# Patient Record
Sex: Male | Born: 1935 | Race: White | Hispanic: No | Smoking: Former smoker
Health system: Southern US, Community
[De-identification: ages and names within clinical notes are randomized; demographics above are authoritative.]

## PROBLEM LIST (undated history)

## (undated) DIAGNOSIS — M1990 Unspecified osteoarthritis, unspecified site: Secondary | ICD-10-CM

## (undated) DIAGNOSIS — D509 Iron deficiency anemia, unspecified: Principal | ICD-10-CM

## (undated) DIAGNOSIS — E785 Hyperlipidemia, unspecified: Secondary | ICD-10-CM

## (undated) DIAGNOSIS — I10 Essential (primary) hypertension: Secondary | ICD-10-CM

## (undated) DIAGNOSIS — I2510 Atherosclerotic heart disease of native coronary artery without angina pectoris: Secondary | ICD-10-CM

## (undated) HISTORY — DX: Unspecified osteoarthritis, unspecified site: M19.90

## (undated) HISTORY — DX: Hyperlipidemia, unspecified: E78.5

## (undated) HISTORY — DX: Atherosclerotic heart disease of native coronary artery without angina pectoris: I25.10

## (undated) HISTORY — PX: JOINT REPLACEMENT: SHX530

## (undated) HISTORY — DX: Essential (primary) hypertension: I10

## (undated) HISTORY — DX: Iron deficiency anemia, unspecified: D50.9

## (undated) HISTORY — PX: OTHER SURGICAL HISTORY: SHX169

---
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
## 1966-10-20 HISTORY — PX: CIRCUMCISION: SUR203

## 2000-01-13 ENCOUNTER — Ambulatory Visit (HOSPITAL_COMMUNITY): Admission: RE | Admit: 2000-01-13 | Discharge: 2000-01-14 | Payer: Self-pay | Admitting: Cardiology

## 2000-01-13 HISTORY — PX: CARDIAC CATHETERIZATION: SHX172

## 2000-05-05 ENCOUNTER — Ambulatory Visit (HOSPITAL_COMMUNITY): Admission: RE | Admit: 2000-05-05 | Discharge: 2000-05-06 | Payer: Self-pay | Admitting: Cardiology

## 2000-05-05 HISTORY — PX: CARDIAC CATHETERIZATION: SHX172

## 2010-05-27 ENCOUNTER — Ambulatory Visit: Payer: Self-pay | Admitting: Cardiology

## 2010-05-30 ENCOUNTER — Ambulatory Visit: Payer: Self-pay | Admitting: Cardiology

## 2010-07-08 ENCOUNTER — Inpatient Hospital Stay (HOSPITAL_COMMUNITY): Admission: RE | Admit: 2010-07-08 | Discharge: 2010-07-13 | Payer: Self-pay | Admitting: Orthopedic Surgery

## 2010-10-08 ENCOUNTER — Ambulatory Visit: Payer: Self-pay | Admitting: Cardiology

## 2010-10-10 ENCOUNTER — Ambulatory Visit: Payer: Self-pay | Admitting: Cardiology

## 2011-01-02 LAB — BASIC METABOLIC PANEL
BUN: 19 mg/dL (ref 6–23)
BUN: 22 mg/dL (ref 6–23)
CO2: 25 mEq/L (ref 19–32)
CO2: 25 mEq/L (ref 19–32)
CO2: 27 mEq/L (ref 19–32)
Chloride: 101 mEq/L (ref 96–112)
Chloride: 102 mEq/L (ref 96–112)
Chloride: 104 mEq/L (ref 96–112)
Chloride: 105 mEq/L (ref 96–112)
GFR calc Af Amer: >60 mL/min (ref >60)
GFR calc Af Amer: >60 mL/min (ref >60)
GFR calc non Af Amer: >60 mL/min (ref >60)
Glucose, Bld: 102 mg/dL — ABNORMAL HIGH (ref 70–99)
Glucose, Bld: 131 mg/dL — ABNORMAL HIGH (ref 70–99)
Potassium: 3.7 mEq/L (ref 3.5–5.1)
Potassium: 3.8 mEq/L (ref 3.5–5.1)
Potassium: 3.9 mEq/L (ref 3.5–5.1)
Sodium: 132 mEq/L — ABNORMAL LOW (ref 135–145)
Sodium: 133 mEq/L — ABNORMAL LOW (ref 135–145)

## 2011-01-02 LAB — CBC
HCT: 22.9 % — ABNORMAL LOW (ref 39.0–52.0)
HCT: 23.9 % — ABNORMAL LOW (ref 39.0–52.0)
HCT: 25.6 % — ABNORMAL LOW (ref 39.0–52.0)
HCT: 27.5 % — ABNORMAL LOW (ref 39.0–52.0)
Hemoglobin: 8.1 g/dL — ABNORMAL LOW (ref 13.0–17.0)
Hemoglobin: 8.6 g/dL — ABNORMAL LOW (ref 13.0–17.0)
MCH: 27.8 pg (ref 26.0–34.0)
MCH: 28.4 pg (ref 26.0–34.0)
MCH: 28.2 pg (ref 26.0–34.0)
MCHC: 34.1 g/dL (ref 30.0–36.0)
MCHC: 34 g/dL (ref 30.0–36.0)
MCV: 82.4 fL (ref 78.0–100.0)
MCV: 82.7 fL (ref 78.0–100.0)
MCV: 82.8 fL (ref 78.0–100.0)
MCV: 83 fL (ref 78.0–100.0)
Platelets: 216 10*3/uL (ref 150–400)
RBC: 2.76 MIL/uL — ABNORMAL LOW (ref 4.22–5.81)
RBC: 3.1 MIL/uL — ABNORMAL LOW (ref 4.22–5.81)
RBC: 3.31 MIL/uL — ABNORMAL LOW (ref 4.22–5.81)
RDW: 14 % (ref 11.5–15.5)
WBC: 4 10*3/uL (ref 4.0–10.5)
WBC: 4.3 10*3/uL (ref 4.0–10.5)

## 2011-01-02 LAB — URINALYSIS, ROUTINE W REFLEX MICROSCOPIC
Bilirubin Urine: NEGATIVE
Hgb urine dipstick: NEGATIVE
Nitrite: NEGATIVE
Specific Gravity, Urine: 1.014 (ref 1.005–1.030)
pH: 7 (ref 5.0–8.0)

## 2011-01-02 LAB — COMPREHENSIVE METABOLIC PANEL
AST: 20 U/L (ref 0–37)
Albumin: 3.9 g/dL (ref 3.5–5.2)
CO2: 30 mEq/L (ref 19–32)
Calcium: 9.4 mg/dL (ref 8.4–10.5)
Creatinine, Ser: 1.47 mg/dL (ref 0.4–1.5)
GFR calc Af Amer: 55 mL/min — ABNORMAL LOW (ref >60)
GFR calc non Af Amer: 46 mL/min — ABNORMAL LOW (ref >60)

## 2011-01-02 LAB — SURGICAL PCR SCREEN
MRSA, PCR: NEGATIVE
Staphylococcus aureus: POSITIVE — AB

## 2011-01-02 LAB — PROTIME-INR
INR: 2 — ABNORMAL HIGH (ref 0.00–1.49)
INR: 2.03 — ABNORMAL HIGH (ref 0.00–1.49)
Prothrombin Time: 17.3 seconds — ABNORMAL HIGH (ref 11.6–15.2)
Prothrombin Time: 22.8 seconds — ABNORMAL HIGH (ref 11.6–15.2)

## 2011-01-02 LAB — HEMOGLOBIN AND HEMATOCRIT, BLOOD
HCT: 30.3 % — ABNORMAL LOW (ref 39.0–52.0)
Hemoglobin: 10.5 g/dL — ABNORMAL LOW (ref 13.0–17.0)

## 2011-01-02 LAB — CROSSMATCH
ABO/RH(D): O NEG
Antibody Screen: NEGATIVE

## 2011-01-02 LAB — TYPE AND SCREEN: Antibody Screen: NEGATIVE

## 2011-01-02 LAB — APTT: aPTT: 29 seconds (ref 24–37)

## 2011-03-07 NOTE — Cardiovascular Report (Signed)
. [facility]  Patient:    Luis Rowe, Luis Rowe                       MRN: [id number] Proc. Date: 05/05/00 Adm. Date:  [id number] Attending:  Eleanora Neighbor                        Cardiac Catheterization  PROCEDURES: 1. Left heart catheterization. 2. Selective coronary angiography. 3. Left ventricular angiography. 4. Angioplasty (cutting balloon for in-stent restenosis).  TYPE AND SITE OF ENTRY:  Percutaneous right femoral artery, with Perclose.  CATHETERS: 1. 6-French four-curved Judkins right and left coronary catheters. 2. 6-French pigtail ventriculographic catheter. 3. 7-French JY7 guiding catheter. 4. A 3.75 x 10 mm cutting balloon. 5. Guide Wire Adult nurse.  MEDICATIONS GIVEN DURING PROCEDURE:  Heparin 4000 units IV, ReoPro, Versed.  COMMENTS:  The patient tolerated the procedure well.  HEMODYNAMIC DATA: 1. Aortic pressure:  148/67. 2. Left ventricular pressure:  147/15. There was no aortic valve gradient noted on pullback.  ANGIOGRAPHIC DATA:  LEFT VENTRICULOGRAM:  Performed in the RAO position.  The overall cardiac size and silhouette are within normal limits.  Left ventricular function is normal.   CORONARY ARTERIES: 1. LEFT MAIN:  Has a 20-30% ostial narrowing. 2. LEFT ANTERIOR DESCENDING:  Has a 90-95% stenosis within the proximal 1/3 of    the stent.  There is a moderately large bifurcating diagonal system with a    30-40% proximal narrowing.  At this bifurcation there is another 30-40%    narrowing within the left anterior descending artery itself. 3. LEFT CIRCUMFLEX:  Bifurcates into two moderate sized marginal vessels;    without significant stenosis. 4. RIGHT CORONARY ARTERY:  A reasonably large and dominant vessel.  There are    irregularities proximally.  At the acute margin there is no greater than    30-40% narrowing.  In the posterior descending vessel (approximately 3 cm    after the crux), there is a 60%  narrowing at the point of bifurcation. It    is a relatively small vessel beyond that.  There is a continuation branch    that is free of significant stenosis.  ANGIOPLASTY PROCEDURE:  We exchanged for the 7-French guide systems.  We were able to satisfactorily use this without catheter dampening to a significant degree.  The Patriot wire was passed into the distal left anterior descending.  We were able to position the cutting balloon without particular difficulty into the proximal portions of the stent; felt comfortable that it really did not extend beyond the more proximal margin of the stent.  The balloon was inflated to 8 atm.  It was a 3.75 balloon, which would be the maximum that the stent was inflated.  The final angiographic result was felt to be excellent.  There was residual mild narrowing proximal to the stent, but it was felt to be less than 30% and did not warrant angioplasty at that junction.  OVERALL IMPRESSION: 1. Successful treatment of in-stent restenosis. 2. Moderate distal right coronary artery disease. 3. Normal left ventricular function. 4. Mild proximal left main coronary artery stenosis.  DISCUSSION:  It is felt that the patient has a satisfactory chance of having successful treatment of the restenotic area within the stent. DD:  05/05/00 TD:  05/06/00 Job: [id number]

## 2011-03-07 NOTE — Discharge Summary (Signed)
[place]. [facility]  Patient:    Luis Rowe, Luis Rowe                         MRN: [id number] Adm. Date:  05/05/00 Disc. Date: 05/06/00 Attending:  Colleen Can. Deborah Chalk, M.D. Dictator:   Jennet Maduro. Earl Gala, R.N., A.N.P. CC:         Clovis Pu. Patty Sermons, M.D.                           Discharge Summary  PRIMARY DISCHARGE DIAGNOSIS:  Recurrent chest pain, consistent with in-stent restenosis with subsequent elective cardiac catheterization and angioplasty with a 3.75 x 10 mm cutting balloon of the left anterior descending coronary artery.  SECONDARY DISCHARGE DIAGNOSES: 1. Atherosclerotic cardiovascular disease with a previous history of cardiac    catheterization on January 13, 2000 with subsequent stent deployment to the    left anterior descending.  There was residual 70% mid posterior descending    narrowing. 2. Hypercholesterolemia, currently on Zocor. 3. Osteoarthritis.  HISTORY OF PRESENT ILLNESS:  Luis Rowe is a very pleasant 77 year old white male who has known atherosclerotic cardiovascular disease.  He underwent cardiac catheterization due to episodes of exertional angina in March 2001. At that time, he had a stent placed to the LAD, and presents now with a two- to three-day history of recurrent angina that is exertional in nature and relieved promptly with rest and nitroglycerin.  It is identical to his previous chest pain syndrome.  He was subsequently referred on for repeat cardiac catheterization.  Please see the dictated history and physical for further patient presentation and profile.  ADMISSION LABORATORY DATA:  Chemistry studies were all within normal limits with sodium 138, potassium 4.7, chloride 98, CO2 28, BUN 22, creatinine 1.1, glucose 88.  PT and PTT were unremarkable.  CBC showed hemoglobin 14, hematocrit 42, white count 7.8, and platelets 222.  HOSPITAL COURSE:  The patient was admitted from the short-stay center in order to undergo  coronary angiography per Dr. Roger Shelter.  The overall procedure was tolerated well without any known complications.  Left ventricular function is normal.  There are 20-30% proximal narrowings of the left main coronary.  The left anterior descending demonstrated a 90+% in-stent restenosis.  The left circumflex demonstrates a bifurcation at the marginal vessel and is unremarkable.  The right coronary artery has a 60-70% distal narrowing in the posterior descending.  Angioplasty was subsequently performed with a 3.75 x 10 mm cutting balloon with an overall excellent result obtained. The patient did receive IV ReoPro, as well as IV nitroglycerin and was subsequently transferred to 6500 for further monitoring and evaluation.  Today, on May 06, 2000, he continues to do well.  He has had no recurrent symptoms.  He has been up and ambulatory.  Postprocedure lab work is satisfactory and he is felt to be stable for discharge today.  CONDITION ON DISCHARGE:  Stable.  DISCHARGE MEDICATIONS: 1. He will resume aspirin daily. 2. Vioxx 25 mg daily. 3. Toprol XL 50 mg daily. 4. Zocor 80 mg daily. 5. We will add Plavix back 75 mg for the next 21 days to be taken along with    his aspirin. 6. He may resume his vitamins as taken before.  ACTIVITY:  Activity is to be light for the next few days.  DIET:  Low fat/low cholesterol.  WOUND CARE:  He is to remove the Perclose  dressing later on today and apply a Band-Aid with Neosporin for the next three days and to keep the groin clean with soap and water.  He is to call the office for any problems.  FOLLOW-UP:  Follow up with Dr. Patty Sermons in the office in approximately 10-14 days.  He is asked to call to set that appointment up. DD:  05/06/00 TD:  05/07/00 Job: [id number]

## 2011-03-07 NOTE — H&P (Signed)
. [facility]  Patient:    Luis Rowe, Luis Rowe                       MRN: [id number] Adm. Date:  [id number] Disc. Date: [id number] Attending:  Eleanora Neighbor Dictator:   Jennet Maduro. Earl Gala, R.N., A.N.P. CC:         Clovis Pu. Patty Sermons, M.D.             Colleen Can. Deborah Chalk, M.D.                         History and Physical  CHIEF COMPLAINT:  Recurrent chest discomfort.  HISTORY OF PRESENT ILLNESS:  Mr. Luis Rowe is a very pleasant 77 year old white male who has known atherosclerotic cardiovascular disease.  He has had previous stent deployment to the left anterior descending coronary artery on January 13, 2000.  He presents on May 04, 2000 as a work-in appointment with Dr. Patty Sermons with complaints of recurrent chest pain.  His symptoms are identical to those he was experiencing prior to last catheterization.  He notes that towards the mid part of last week, he began having just some generalized weakness and fatigue.  By Friday evening, with just little exertion, he was having recurrent chest discomfort.  It has been nonradiating and not associated with any shortness of breath, diaphoresis, or nausea.  He has taken nitroglycerin on two separate occasions with instantaneous relief. He is seen in the office and now referred on for repeat cardiac catheterization.  PAST MEDICAL HISTORY:  1. Atherosclerotic cardiovascular disease with a history of stent placement to the left anterior descending on January 13, 2000. He had residual 70% mid posterior descending lesion at that time noted. 2. Hypercholesterolemia.  3. Osteoarthritis.  4. Status post tonsillectomy. 5. Status post prostate surgery in 1987.  ALLERGIES:  None known.  MEDICATIONS: 1. Aspirin daily. 2. Vioxx 25 mg daily. 3. Toprol XL 50 mg daily. 4. Zocor 80 mg daily.  He also takes multiple vitamins, which include Osteo B-Flex twice a day, multivitamins daily, vitamin E daily, vitamin C daily, zinc  and selenium daily, and saw palmetto daily, and ginkgo biloba daily.  FAMILY HISTORY:  Unchanged per the prior record.  SOCIAL HISTORY:  Unchanged per the prior record.  REVIEW OF SYSTEMS:  Otherwise as stated above.  He has had some considerable arthritic discomfort which has been treated with previous cortisone injections.  He remains on chronic Vioxx use.  Otherwise, review of systems is unremarkable.  PHYSICAL EXAMINATION:  GENERAL:  He is a very pleasant elderly white male who appears younger than his stated age.  VITAL SIGNS:  Blood pressure 140/70, heart rate 50, respirations 18.  He is afebrile.  SKIN:  Warm and dry.  Color unremarkable.  LUNGS:  Clear.  HEART:  Shows a regular rhythm.  ABDOMEN:  Soft, positive bowel sounds, nontender.  EXTREMITIES:  Without edema.  NEUROLOGIC:  Intact.  RECTAL:  Deferred.  LABORATORY:  Currently pending.  IMPRESSION: 1. Recurrent chest pain. 2. Atherosclerotic cardiovascular disease with a previous history of stent    deployment to the left anterior descending in March 2001. 3. Hypercholesterolemia.  PLAN:  Will refer on for elective cardiac catheterization and probable angioplasty.  The risks, procedure, and benefits have been explained and he is willing to proceed.DD:  05/04/00 TD:  05/04/00 Job: [id number]

## 2011-03-07 NOTE — Consult Note (Signed)
[place]. [facility]  Patient:    Luis Rowe, Luis Rowe                         MRN: Proc. Date: 01/13/00 Attending:  Colleen Can. Deborah Chalk, M.D. Dictator:   Jennet Maduro. Earl Gala, R.N., A.N.P. CC:         Colleen Can. Deborah Chalk, M.D.             Thomas A. Patty Sermons, M.D.                          Consultation Report  CHIEF COMPLAINT:  Exertional chest pain.  HISTORY OF PRESENT ILLNESS:  Luis Rowe is a very pleasant 77 year old male who has had for the past two to three months complaints of an exertional-type chest discomfort which would occur across the midsternal region into the shoulders. e noted that it was more attributable to when the weather was quite cold; however, last weekend when he was out push mowing his yard, he had to stop four times due to the discomfort.  He was short of breath.  He has been somewhat fatigued.  He has had no symptoms at rest.  There has been no nausea, no vomiting, no diaphoresis. He was seen for his regular appointment with Dr. Patty Sermons on January 09, 2000, was noted to have a significantly abnormal EKG compared to his old tracings.  He demonstrated T wave inversion in leads I, aVL, V2 through V5, consistent with anteroseptal ischemia.  He is now referred on for cardiac catheterization.  PAST MEDICAL HISTORY:  Hypercholesterolemia, arthritis, status post tonsillectomy, status post prostate surgery by Dr. Dannette Barbara in 1987.  ALLERGIES:  None known.  CURRENT MEDICATIONS:  1. Lipitor 40 mg a day.  2. Aspirin daily.  3. Celebrex 200 mg a day.  4. Plavix 75 mg a day.  5. Toprol XL 50 mg a day.  6. Magnesium 250 q.d.  7. Ginkgo biloba daily.  8. Saw Palmetto daily.  9. Zinc daily. 10. Selenium daily. 11. Multivitamin daily. 12. OsteoBiFlex MS twice a day.  FAMILY HISTORY:  Father died at 45 with heart disease and heart failure. Mother died at 2 with a history of cancer of the lung.  SOCIAL HISTORY:  He has been widowed for the  past six years.  He is retired from [employer].  There has been no tobacco products since approximately 1980.  REVIEW OF SYSTEMS:  Otherwise as stated above.  PHYSICAL EXAMINATION:  GENERAL:  He is a very pleasant white male in no acute distress.  VITAL SIGNS:  Blood pressure 120/80, heart rate 52, respirations 16.  SKIN:  Warm and dry.  Color is unremarkable.  LUNGS:  Clear.  HEART:  Regular rhythm.  ABDOMEN:  Soft, positive bowel sounds, nontender.  EXTREMITIES:  Without edema.  LABORATORY DATA:  Currently pending.  IMPRESSION: 1. Exertional chest pain, most likely cardiac in origin in the setting of an    abnormal EKG 2. Hypercholesterolemia, currently on Lipitor.  PLAN:  Will proceed on with elective cardiac catheterization and probable angioplasty on Monday morning.  He is to call if problems would arise over the interim. DD:  01/10/00 TD:  01/10/00 Job: [id number]

## 2011-03-07 NOTE — Cardiovascular Report (Signed)
[place]. [facility]  Patient:    Luis Rowe, Luis Rowe                       MRN: [id number] Proc. Date: 01/13/00 Adm. Date:  [id number] Disc. Date: [id number] Attending:  Eleanora Neighbor CC:         Thomas A. Patty Sermons, M.D.                        Cardiac Catheterization  INDICATIONS:  Mr. Jessop is a 77 year old gentleman who presents with a two to three month history of exertional chest pain, abnormal EKG, and Cardiolite study. He is referred for catheterization.  PROCEDURE:  Left heart catheterization with selective coronary angiography, left ventricular angiography, and stent placement in the left anterior descending coronary.  Type and site of entry is percutaneous right femoral artery with Perclose.  CATHETERS:  6 [place], 4 curved Judkins right and left coronary catheters, 6 French pigtail ventricular catheter, 7 [place] JL-4 guide, 3.0 x 20 mm Cross Sail balloon, and a 3.5 x 13 mm Tristar stent.  MEDICATIONS GIVEN DURING THE PROCEDURE:  Heparin 6400 units and Integrilin and V nitroglycerin as well as Versed 2 mg IV.  COMMENTS:  Patient tolerated the procedure well.  HEMODYNAMIC DATA:  The aortic pressure was 120/63.  The LV pressure was 142/23;  however, on aortic valve pullback, there was no gradient.  ANGIOGRAPHIC DATA: 1. Left main coronary artery:  Normal. 2. Left anterior descending:  Left anterior descending has a focal 99% stenosis at    a second septal perforating branch.  It continues on and bifurcates.  At the    bifurcation, there is mild narrowing of 30% nature but no real significant    disease. 3. Left circumflex:  Left circumflex bifurcates.  There is a high,    intermediate-type vessel.  The left circumflex is free of significant disease. 4. Right coronary artery:  Right coronary artery is a dominant vessel.  The    posterior descending has narrowing of approximately 60 to 70% in its mid    portion.  It is a potential  area of ischemia.  LEFT VENTRICULAR ANGIOGRAM:  The left ventricular angiogram was performed at the RAO position.  Overall, a cardiac size and silhouette are normal.  There was mild anterior hypokinesis.  The global ejection fraction would be 50%.  ANGIOPLASTY PROCEDURE:  At this point in time, we exchanged catheters for a 7 [place] system.  A high torque floppy guide wire was passed distally.  The ACT was approximately 300.  A 3-0 balloon was inflated to a maximum of 8 atmospheres. his resulted in satisfactory predilatation.  We returned with a 3.5 x 13 mm Tristar  stent.  This was positioned with a maximum inflation of 12 atmospheres.  The result was satisfactory with a somewhat negative lumen diameter with the stent being slightly larger than the native vessel before and after but still a satisfactory match and, overall, felt to be an excellent result.  OVERALL IMPRESSION: 1. Severe stenosis in the proximal left anterior descending with successful    angioplasty and stenting. 2. Moderately severe disease in the mid posterior descending coronary with mild    disease in the distal left anterior descending.  DISCUSSION:  It is felt that Mr. Endsley clearly should do better in that his culprit vessel has been addressed.  He still needs to have aggressive cardiovascular risk management because of  atherosclerosis, otherwise. DD:  01/13/00 TD:  01/14/00 Job: [id number]

## 2011-03-07 NOTE — Discharge Summary (Signed)
[place]. [facility]  Patient:    Luis Rowe, Luis Rowe                       MRN: [id number] Adm. Date:  [id number] Disc. Date: [id number] Attending:  Eleanora Neighbor Dictator:   Jennet Maduro. Earl Gala, R.N., A.N.P. CC:         Clovis Pu. Patty Sermons, M.D.                           Discharge Summary  DISCHARGE DIAGNOSES: 1. Exertional chest pain with an abnormal EKG, with subsequent cardiac    catheterization and stent deployment to the left anterior descending. 2. Atherosclerotic cardiovascular disease with residual 70% narrowing in the mid    posterior descending. 3. Hypercholesterolemia, currently on Lipitor.  HISTORY OF PRESENT ILLNESS:  Luis Rowe is a very pleasant 77 year old white male who was referred for cardiac catheterization after a two to three month history of exertional chest pain associated with an abnormal EKG.  He had been placed on Toprol and plavix and referred on for cardiac catheterization.  Please see the dictated history and physical for further patient presentation and profile.  LABORATORY DATA ON ADMISSION:  White count was 6.4, hemoglobin 15, hematocrit 44. Platelets were 278.  PT and PTT were unremarkable.  Chemistries revealed sodium  139, potassium 4.8, chloride 105, CO2 27, BUN 24, creatinine 1.3, and glucose 92.  Lipid panel showed total cholesterol 207, HDL 48, LDL 121, triglycerides 191.  HOSPITAL COURSE:  Patient was admitted from the short stay center to undergo coronary angiography per Dr. Roger Shelter.  The overall procedure was tolerated well without any known complications.  Left ventricular function demonstrated mild anterior hypokinesis.  The LAD demonstrated a 99% narrowing.  Angioplasty with subsequent stent with a tetra 3.5 x 13 mm stent was placed with an overall satisfactory result obtained.  Left circumflex was basically unremarkable.  The  right coronary artery demonstrated a 70% narrowing in the mid  portion of the posterior descending.  Patient did receive IV Integrilin x 18 hours and also had Perclose.  Post procedure, the patient was transferred to 6500 for further monitoring and evaluation.  There was some slight oozing from the Perclose dressing and pressure dressing was applied.  By the morning of January 14, 2000, the patient has continued to do well.  The right groin is satisfactory.  He has been up and ambulatory without problems, and is felt to be stable for discharge today.  DISCHARGE CONDITION:  Improved.  DISCHARGE MEDICATIONS: 1. Resume Lipitor 40 mg a day. 2. Aspirin daily. 3. Celebrex 200 mg daily. 4. Plavix 75 mg daily for the next three weeks. 5. Toprol XL 50 mg a day.  ACTIVITY:  Light for the next few days.  DIET:  Low fat.  WOUND CARE:  He is to place an ice pack as needed.  He will remove the Perclose  dressing later on today and wash the area with soap and water and apply Neosporin for the next two to three days.  Otherwise, he will follow up with Dr. Patty Sermons in seven to 10 days or sooner if problems would arise. DD:  01/14/00 TD:  01/14/00 Job: [id number]

## 2011-03-11 ENCOUNTER — Other Ambulatory Visit: Payer: Self-pay | Admitting: *Deleted

## 2011-03-11 MED ORDER — FENOFIBRATE 160 MG PO TABS: 160.0000 mg | ORAL_TABLET | Freq: Every day | ORAL | Status: DC

## 2011-03-11 NOTE — Telephone Encounter (Signed)
Patient brought in refill request to be sent to [facility]

## 2011-03-21 ENCOUNTER — Telehealth: Payer: Self-pay | Admitting: Cardiology

## 2011-03-21 NOTE — Telephone Encounter (Signed)
Patient called re- article he read in paper about Metoprolol causing cold hands,Dr. Patty Sermons is aware of this but would like him to continue.He could cut to 1/2 daily but patient will stay on 1 daily

## 2011-03-21 NOTE — Telephone Encounter (Signed)
CALL PT REGARDING SOME SCRIPTS HE HAS COMING UP. CHART IN BOX.

## 2011-04-09 ENCOUNTER — Other Ambulatory Visit: Payer: Self-pay | Admitting: *Deleted

## 2011-04-09 DIAGNOSIS — E7800 Pure hypercholesterolemia, unspecified: Secondary | ICD-10-CM

## 2011-04-09 MED ORDER — EZETIMIBE 10 MG PO TABS: 10.0000 mg | ORAL_TABLET | Freq: Every day | ORAL | Status: DC

## 2011-04-09 NOTE — Telephone Encounter (Signed)
Faxed refill for zetia to [facility]

## 2011-04-29 ENCOUNTER — Other Ambulatory Visit: Payer: Self-pay | Admitting: *Deleted

## 2011-04-29 DIAGNOSIS — E785 Hyperlipidemia, unspecified: Secondary | ICD-10-CM

## 2011-05-01 ENCOUNTER — Other Ambulatory Visit: Payer: Self-pay | Admitting: *Deleted

## 2011-05-01 MED ORDER — METOPROLOL SUCCINATE ER 50 MG PO TB24: 50.0000 mg | ORAL_TABLET | Freq: Every day | ORAL | Status: DC

## 2011-05-01 NOTE — Telephone Encounter (Signed)
Patient request refill. Completed, has f/u app.Alfonso Ramus RN

## 2011-05-05 ENCOUNTER — Other Ambulatory Visit (INDEPENDENT_AMBULATORY_CARE_PROVIDER_SITE_OTHER): Payer: Medicare Other | Admitting: *Deleted

## 2011-05-05 DIAGNOSIS — E785 Hyperlipidemia, unspecified: Secondary | ICD-10-CM

## 2011-05-05 LAB — LIPID PANEL
HDL: 43.5 mg/dL (ref >39.00)
LDL Cholesterol: 103 mg/dL — ABNORMAL HIGH (ref 0–99)
Total CHOL/HDL Ratio: 4
Triglycerides: 58 mg/dL (ref 0.0–149.0)
VLDL: 11.6 mg/dL (ref 0.0–40.0)

## 2011-05-05 LAB — BASIC METABOLIC PANEL
CO2: 27 mEq/L (ref 19–32)
Calcium: 9.2 mg/dL (ref 8.4–10.5)
GFR: 36.59 mL/min — ABNORMAL LOW (ref >60.00)
Sodium: 139 mEq/L (ref 135–145)

## 2011-05-05 LAB — HEPATIC FUNCTION PANEL
Alkaline Phosphatase: 56 U/L (ref 39–117)
Bilirubin, Direct: 0.1 mg/dL (ref 0.0–0.3)
Total Bilirubin: 0.6 mg/dL (ref 0.3–1.2)
Total Protein: 7.4 g/dL (ref 6.0–8.3)

## 2011-05-07 ENCOUNTER — Encounter: Payer: Self-pay | Admitting: Cardiology

## 2011-05-07 ENCOUNTER — Ambulatory Visit (INDEPENDENT_AMBULATORY_CARE_PROVIDER_SITE_OTHER): Payer: Medicare Other | Admitting: Cardiology

## 2011-05-07 DIAGNOSIS — I259 Chronic ischemic heart disease, unspecified: Secondary | ICD-10-CM | POA: Insufficient documentation

## 2011-05-07 DIAGNOSIS — Z79899 Other long term (current) drug therapy: Secondary | ICD-10-CM

## 2011-05-07 DIAGNOSIS — I119 Hypertensive heart disease without heart failure: Secondary | ICD-10-CM

## 2011-05-07 DIAGNOSIS — R52 Pain, unspecified: Secondary | ICD-10-CM

## 2011-05-07 DIAGNOSIS — E7800 Pure hypercholesterolemia, unspecified: Secondary | ICD-10-CM | POA: Insufficient documentation

## 2011-05-07 DIAGNOSIS — N400 Enlarged prostate without lower urinary tract symptoms: Secondary | ICD-10-CM

## 2011-05-07 DIAGNOSIS — E785 Hyperlipidemia, unspecified: Secondary | ICD-10-CM

## 2011-05-07 MED ORDER — TRAMADOL HCL 50 MG PO TABS: 50.0000 mg | ORAL_TABLET | Freq: Every day | ORAL | Status: DC

## 2011-05-07 NOTE — Assessment & Plan Note (Signed)
Patient has a history of essential hypertension.  His recent lab work reveals that his kidneys are much drier signifying the fact that he's not drinking enough water in the hot weather.  He does drink a moderate amount of Gatorade in the hot weather which may be contributing to the fact that his blood pressure is higher today.  He will monitor his blood pressure at home and let us know if it continues to run high.  He is not having any headaches or dizzy spells or other symptoms from his high blood pressure today

## 2011-05-07 NOTE — Progress Notes (Signed)
Luis Rowe Date of Birth:  Jul 01, 1926 [facility] [address]   [address] [place], [place]  [address] [phone]           Fax   [phone]  History of Present Illness: This pleasant 77 year old gentleman has a history of known ischemic heart disease or he had a stent in 2001 and again in 2003.  His last LexiScan Cardiolite stress test was normal on 03/25/10 with an ejection fraction of 71%.  He has not been experiencing any chest discomfort.  He experienced a right total knee replacement in September 2011 and is doing well from that.  He has a history of hypercholesterolemia and is intolerant of statins but is able to take fenofibrate and Zetia.  He feels well with no new intercurrent symptoms.  He does admit to not drinking enough water in the hot weather and we did note that his BUN and creatinine were higher today.  He also has known BPH with nocturia and frequency and is followed by Dr. Shiela Mayer  Current Outpatient Prescriptions  Medication Sig Dispense Refill  . aspirin 325 MG EC tablet Take 325 mg by mouth daily.        . Calcium Carbonate (CALCIUM 600 PO) Take by mouth daily.        . Cholecalciferol (VITAMIN D PO) Take 2,000 Int'l Units by mouth daily.        . Doxylamine Succinate, Sleep, (SLEEP AID PO) Take by mouth. 1/2 daily       . ezetimibe (ZETIA) 10 MG tablet Take 1 tablet (10 mg total) by mouth daily.  90 tablet  3  . fenofibrate 160 MG tablet Take 1 tablet (160 mg total) by mouth daily.  90 tablet  3  . MAGNESIUM PO Take by mouth. Magnesium with zinc       . metoprolol (TOPROL XL) 50 MG 24 hr tablet Take 1 tablet (50 mg total) by mouth daily.  90 tablet  1  . Multiple Vitamin (MULTIVITAMIN) tablet Take 1 tablet by mouth daily.        . Naproxen Sodium (ALEVE) 220 MG CAPS Take by mouth.        . niacin 500 MG tablet Take 500 mg by mouth 2 (two) times daily with a meal.        . Omega-3 Fatty Acids (FISH OIL) 1000 MG CAPS Take by mouth 2  (two) times daily.        Marland Kitchen omeprazole (PRILOSEC) 20 MG capsule Take 20 mg by mouth daily.        . polyethylene glycol (MIRALAX / GLYCOLAX) packet Take 17 g by mouth daily.        . Saw Palmetto, Serenoa repens, (SAW PALMETTO PO) Take 450 mg by mouth daily.        . Selenium 200 MCG CAPS Take by mouth daily.        . Tamsulosin HCl (FLOMAX) 0.4 MG CAPS Take by mouth daily. Dr. Vonita Moss       . traMADol (ULTRAM) 50 MG tablet Take 1 tablet (50 mg total) by mouth daily.  90 tablet  3    Allergies no known allergies  Patient Active Problem List  Diagnoses  . Benign hypertensive heart disease without heart failure  . Hypercholesterolemia  . Ischemic heart disease  . BPH (benign prostatic hyperplasia)    History  Smoking status  . Former Smoker  . Types: Cigarettes  Smokeless tobacco  . Not  on file    History  Alcohol Use: Not on file    No family history on file.  Review of Systems: Constitutional: no fever chills diaphoresis or fatigue or change in weight.  Head and neck: no hearing loss, no epistaxis, no photophobia or visual disturbance. Respiratory: No cough, shortness of breath or wheezing. Cardiovascular: No chest pain peripheral edema, palpitations. Gastrointestinal: No abdominal distention, no abdominal pain, no change in bowel habits hematochezia or melena. Genitourinary: No dysuria, no frequency, no urgency, no nocturia. Musculoskeletal:No arthralgias, no back pain, no gait disturbance or myalgias. Neurological: No dizziness, no headaches, no numbness, no seizures, no syncope, no weakness, no tremors. Hematologic: No lymphadenopathy, no easy bruising. Psychiatric: No confusion, no hallucinations, no sleep disturbance.    Physical Exam: Filed Vitals:   05/07/11 0904  BP: 160/70  Pulse: 64  The general appearance feels a well-developed elderly gentleman in no distress.The head and neck exam reveals pupils equal and reactive.  Extraocular movements are full.   There is no scleral icterus.  The mouth and pharynx are normal.  The neck is supple.  The carotids reveal no bruits.  The jugular venous pressure is normal.  The  thyroid is not enlarged.  There is no lymphadenopathy.  The chest is clear to percussion and auscultation.  There are no rales or rhonchi.  Expansion of the chest is symmetrical.  The precordium is quiet.  The first heart sound is normal.  The second heart sound is physiologically split.  There is no  gallop rub or click.There is a soft systolic ejection murmur at the base  There is no abnormal lift or heave.  The abdomen is soft and nontender.  The bowel sounds are normal.  The liver and spleen are not enlarged.  There are no abdominal masses.  There are no abdominal bruits.  Extremities reveal good pedal pulses.  There is no phlebitis or edema.  There is no cyanosis or clubbing.  Strength is normal and symmetrical in all extremities.  There is no lateralizing weakness.  There are no sensory deficits.  The skin is warm and dry.  There is no rash.   Assessment / Plan: His lab work was reviewed.  He is to continue same medication.  Recheck in 6 months for followup office visit and fasting lab work.  He is to drink more fluids in terms of water  rather than Gatorade

## 2011-05-07 NOTE — Assessment & Plan Note (Signed)
The patient has a history of hypercholesterolemia.  He is unable to tolerate statins because of myalgias he is on Zetia and fenofibrateAnd his lipid control has improved although his LDL is still slightly high at 103

## 2011-05-07 NOTE — Assessment & Plan Note (Signed)
The patient has a past history of ischemic heart disease he had PTCA with stent in 2001 and again in 2003.  He had a normal walking LexiScan Cardiolite stress test on 03/25/10.  His ejection fraction was 71%.  He has not been experiencing any chest pain or weakness of breath.  He gets exercise working in his yard and mowing his grass.

## 2011-10-30 ENCOUNTER — Encounter: Payer: Self-pay | Admitting: *Deleted

## 2011-10-30 ENCOUNTER — Other Ambulatory Visit (INDEPENDENT_AMBULATORY_CARE_PROVIDER_SITE_OTHER): Payer: Medicare Other | Admitting: *Deleted

## 2011-10-30 DIAGNOSIS — E785 Hyperlipidemia, unspecified: Secondary | ICD-10-CM

## 2011-10-30 DIAGNOSIS — Z79899 Other long term (current) drug therapy: Secondary | ICD-10-CM

## 2011-10-30 DIAGNOSIS — M1990 Unspecified osteoarthritis, unspecified site: Secondary | ICD-10-CM | POA: Insufficient documentation

## 2011-10-30 DIAGNOSIS — I119 Hypertensive heart disease without heart failure: Secondary | ICD-10-CM

## 2011-10-30 LAB — HEPATIC FUNCTION PANEL
ALT: 23 U/L (ref 0–53)
AST: 29 U/L (ref 0–37)
Alkaline Phosphatase: 52 U/L (ref 39–117)
Bilirubin, Direct: 0.1 mg/dL (ref 0.0–0.3)
Total Bilirubin: 0.5 mg/dL (ref 0.3–1.2)
Total Protein: 6.9 g/dL (ref 6.0–8.3)

## 2011-10-30 LAB — CBC WITH DIFFERENTIAL/PLATELET
Basophils Absolute: 0 10*3/uL (ref 0.0–0.1)
Eosinophils Relative: 3.7 % (ref 0.0–5.0)
HCT: 30.7 % — ABNORMAL LOW (ref 39.0–52.0)
Lymphs Abs: 0.6 10*3/uL — ABNORMAL LOW (ref 0.7–4.0)
Monocytes Absolute: 0.3 10*3/uL (ref 0.1–1.0)
Monocytes Relative: 9.6 % (ref 3.0–12.0)
Neutrophils Relative %: 62.8 % (ref 43.0–77.0)
Platelets: 180 10*3/uL (ref 150.0–400.0)
RDW: 16.3 % — ABNORMAL HIGH (ref 11.5–14.6)
WBC: 2.8 10*3/uL — ABNORMAL LOW (ref 4.5–10.5)

## 2011-10-30 LAB — BASIC METABOLIC PANEL
BUN: 34 mg/dL — ABNORMAL HIGH (ref 6–23)
Creatinine, Ser: 1.5 mg/dL (ref 0.4–1.5)
GFR: 46.7 mL/min — ABNORMAL LOW (ref >60.00)
Glucose, Bld: 83 mg/dL (ref 70–99)

## 2011-10-30 LAB — LIPID PANEL: Cholesterol: 173 mg/dL (ref 0–200)

## 2011-11-03 ENCOUNTER — Other Ambulatory Visit: Payer: Self-pay | Admitting: *Deleted

## 2011-11-03 ENCOUNTER — Encounter: Payer: Self-pay | Admitting: Cardiology

## 2011-11-03 ENCOUNTER — Ambulatory Visit (INDEPENDENT_AMBULATORY_CARE_PROVIDER_SITE_OTHER): Payer: Medicare Other | Admitting: Cardiology

## 2011-11-03 VITALS — BP 138/76 | HR 78 | Ht 72.0 in | Wt 185.0 lb

## 2011-11-03 DIAGNOSIS — I2510 Atherosclerotic heart disease of native coronary artery without angina pectoris: Secondary | ICD-10-CM

## 2011-11-03 DIAGNOSIS — E7800 Pure hypercholesterolemia, unspecified: Secondary | ICD-10-CM

## 2011-11-03 DIAGNOSIS — I119 Hypertensive heart disease without heart failure: Secondary | ICD-10-CM

## 2011-11-03 DIAGNOSIS — N400 Enlarged prostate without lower urinary tract symptoms: Secondary | ICD-10-CM

## 2011-11-03 DIAGNOSIS — D509 Iron deficiency anemia, unspecified: Secondary | ICD-10-CM

## 2011-11-03 MED ORDER — METOPROLOL SUCCINATE ER 50 MG PO TB24: 50.0000 mg | ORAL_TABLET | Freq: Every day | ORAL | Status: DC

## 2011-11-03 NOTE — Assessment & Plan Note (Signed)
Patient has not been having chest pain or shortness of breath.  No dizziness or syncope.  His energy level has been slightly less.

## 2011-11-03 NOTE — Telephone Encounter (Signed)
Refilled metoprolol 

## 2011-11-03 NOTE — Progress Notes (Signed)
Luis Rowe Date of Birth:  [birth year] [facility] [address] [place], [place]  [address] [phone]         Fax   [phone]  History of Present Illness: This pleasant 78 year old gentleman is seen for a six-month followup office visit.  He has a history of known ischemic heart disease.  He had stents in 2001 and again in 2003.  His last Cardiolite stress test on 03/25/10 showed no ischemia and his ejection fraction was 71%.  The patient underwent right total knee replacement in September 2011.  Patient has a history of hypercholesterolemia and a history of BPH.  He is followed by urologist Dr. Margarita Grizzle who has taken over from Dr. Vonita Moss who retired.  Current Outpatient Prescriptions  Medication Sig Dispense Refill  . aspirin 325 MG EC tablet Take 325 mg by mouth daily.        . Doxylamine Succinate, Sleep, (SLEEP AID PO) Take by mouth. 1/2 daily       . ezetimibe (ZETIA) 10 MG tablet Take 1 tablet (10 mg total) by mouth daily.  90 tablet  3  . fenofibrate 160 MG tablet Take 1 tablet (160 mg total) by mouth daily.  90 tablet  3  . IBUPROFEN PO Take by mouth. As needed      . Multiple Vitamin (MULTIVITAMIN) tablet Take 1 tablet by mouth daily.        . Naproxen Sodium (ALEVE) 220 MG CAPS Take by mouth.        . Omega-3 Fatty Acids (FISH OIL) 1000 MG CAPS Take 300 mg by mouth 2 (two) times daily. Taking 1200 daily      . omeprazole (PRILOSEC) 20 MG capsule Take 20 mg by mouth daily.        . polyethylene glycol (MIRALAX / GLYCOLAX) packet Take 17 g by mouth daily.        . propranolol (INDERAL) 20 MG tablet Take 20 mg by mouth as needed.      . Selenium 200 MCG CAPS Take by mouth daily.        . Tamsulosin HCl (FLOMAX) 0.4 MG CAPS Take by mouth daily. Dr. Vonita Moss       . traMADol (ULTRAM) 50 MG tablet Take 1 tablet (50 mg total) by mouth daily.  90 tablet  3  . DISCONTD: metoprolol (TOPROL XL) 50 MG 24 hr tablet Take 1 tablet (50 mg total) by mouth daily.   90 tablet  1  . metoprolol succinate (TOPROL XL) 50 MG 24 hr tablet Take 1 tablet (50 mg total) by mouth daily.  90 tablet  3    Allergies  Allergen Reactions  . Crestor (Rosuvastatin Calcium)     myalgias  . Lipitor (Atorvastatin Calcium)     myalgias  . Zocor (Simvastatin - High Dose)     myalgias    Patient Active Problem List  Diagnoses  . Benign hypertensive heart disease without heart failure  . Hypercholesterolemia  . Ischemic heart disease  . BPH (benign prostatic hyperplasia)  . Arthritis    History  Smoking status  . Former Smoker  . Types: Cigarettes  Smokeless tobacco  . Not on file    History  Alcohol Use: Not on file    No family history on file.  Review of Systems: Constitutional: no fever chills diaphoresis or fatigue or change in weight.  Head and neck: no hearing loss, no epistaxis, no photophobia or visual disturbance. Respiratory: No cough, shortness  of breath or wheezing. Cardiovascular: No chest pain peripheral edema, palpitations. Gastrointestinal: No abdominal distention, no abdominal pain, no change in bowel habits hematochezia or melena. Genitourinary: No dysuria, no frequency, no urgency, no nocturia. Musculoskeletal:No arthralgias, no back pain, no gait disturbance or myalgias. Neurological: No dizziness, no headaches, no numbness, no seizures, no syncope, no weakness, no tremors. Hematologic: No lymphadenopathy, no easy bruising. Psychiatric: No confusion, no hallucinations, no sleep disturbance.    Physical Exam: Filed Vitals:   11/03/11 1016  BP: 138/76  Pulse: 78   the general appearance reveals a well-developed well-nourished elderly gentleman in no distress.The head and neck exam reveals pupils equal and reactive.  Extraocular movements are full.  There is no scleral icterus.  The mouth and pharynx are normal.  The neck is supple.  The carotids reveal no bruits.  The jugular venous pressure is normal.  The  thyroid is not  enlarged.  There is no lymphadenopathy.  The chest is clear to percussion and auscultation.  There are no rales or rhonchi.  Expansion of the chest is symmetrical.  The precordium is quiet.  The first heart sound is normal.  The second heart sound is physiologically split.  There is no murmur gallop rub or click.  There is no abnormal lift or heave.  The abdomen is soft and nontender.  The bowel sounds are normal.  The liver and spleen are not enlarged.  There are no abdominal masses.  There are no abdominal bruits.  Extremities reveal good pedal pulses.  There is no phlebitis or edema.  There is no cyanosis or clubbing.  Strength is normal and symmetrical in all extremities.  There is no lateralizing weakness.  There are no sensory deficits.  The skin is warm and dry.  There is no rash.     Assessment / Plan: Continue same medication.  Recheck in 6 months for followup office visit.  His hemoglobin is low because he has been donating on a regular basis to the [employer].  We advised him no more blood donation and we will recheck a CBC in 6 months.  He has not had any hematochezia or melena.

## 2011-11-03 NOTE — Patient Instructions (Signed)
Your physician wants you to follow-up in: 6 months  You will receive a reminder letter in the mail two months in advance. If you don't receive a letter, please call our office to schedule the follow-up appointment.  Your physician recommends that you continue on your current medications as directed. Please refer to the Current Medication list given to you today.  

## 2011-11-03 NOTE — Assessment & Plan Note (Signed)
His BPH symptoms have remained stable since last visit.  He does have nocturia 3-4 times a night.  He states that his PSA levels have been satisfactory.

## 2011-11-03 NOTE — Assessment & Plan Note (Signed)
The patient has a history of hypercholesterolemia.  He is intolerant of statins.  He reviewed his labs which are satisfactory on current therapy of fenofibrate and Zetia.

## 2012-03-18 ENCOUNTER — Encounter: Payer: Self-pay | Admitting: Cardiology

## 2012-04-11 ENCOUNTER — Other Ambulatory Visit: Payer: Self-pay | Admitting: Cardiology

## 2012-04-12 ENCOUNTER — Other Ambulatory Visit: Payer: Self-pay | Admitting: Cardiology

## 2012-04-12 MED ORDER — FENOFIBRATE 160 MG PO TABS: 160.0000 mg | ORAL_TABLET | Freq: Every day | ORAL | Status: DC

## 2012-04-12 NOTE — Telephone Encounter (Signed)
Pt has two pills left pt has appt July 22

## 2012-04-12 NOTE — Telephone Encounter (Signed)
Refilled fenofibrate and advised patient

## 2012-04-26 ENCOUNTER — Other Ambulatory Visit: Payer: Self-pay | Admitting: Cardiology

## 2012-04-26 DIAGNOSIS — R52 Pain, unspecified: Secondary | ICD-10-CM

## 2012-04-26 NOTE — Telephone Encounter (Signed)
Refill-TraMADol (ULTRAM) 50 MG tablet  (90 day supply)   Verified Pref[facility] as [facility]

## 2012-04-27 MED ORDER — TRAMADOL HCL 50 MG PO TABS: 50.0000 mg | ORAL_TABLET | Freq: Every day | ORAL | Status: DC

## 2012-05-05 ENCOUNTER — Telehealth: Payer: Self-pay | Admitting: Cardiology

## 2012-05-05 DIAGNOSIS — R52 Pain, unspecified: Secondary | ICD-10-CM

## 2012-05-05 MED ORDER — TRAMADOL HCL 50 MG PO TABS: 50.0000 mg | ORAL_TABLET | Freq: Every day | ORAL | Status: DC

## 2012-05-05 NOTE — Telephone Encounter (Signed)
Sent to [facility] and called in 14 day supply to [facility] as requested

## 2012-05-05 NOTE — Telephone Encounter (Signed)
Pt calling re requst for refill of [facility]adol to [facility], they say they do not have a response from [place], the computer shows it was done 04-26-12, pt would like [place] to call [facility] to straighten it out, and only has one pill left, will need 10 pills called into [facility] in [place] to last him until mail order gets to him, pls call when done

## 2012-05-06 ENCOUNTER — Other Ambulatory Visit (INDEPENDENT_AMBULATORY_CARE_PROVIDER_SITE_OTHER): Payer: Medicare Other

## 2012-05-06 DIAGNOSIS — E7800 Pure hypercholesterolemia, unspecified: Secondary | ICD-10-CM

## 2012-05-06 DIAGNOSIS — D509 Iron deficiency anemia, unspecified: Secondary | ICD-10-CM

## 2012-05-06 LAB — BASIC METABOLIC PANEL
BUN: 33 mg/dL — ABNORMAL HIGH (ref 6–23)
Chloride: 104 mEq/L (ref 96–112)
Creatinine, Ser: 1.6 mg/dL — ABNORMAL HIGH (ref 0.4–1.5)
GFR: 45.27 mL/min — ABNORMAL LOW (ref >60.00)

## 2012-05-06 LAB — CBC WITH DIFFERENTIAL/PLATELET
Basophils Relative: 0.6 % (ref 0.0–3.0)
Eosinophils Relative: 3.5 % (ref 0.0–5.0)
HCT: 31.1 % — ABNORMAL LOW (ref 39.0–52.0)
MCV: 71.3 fl — ABNORMAL LOW (ref 78.0–100.0)
Monocytes Absolute: 0.2 10*3/uL (ref 0.1–1.0)
Monocytes Relative: 8.9 % (ref 3.0–12.0)
Neutrophils Relative %: 62.4 % (ref 43.0–77.0)
RBC: 4.36 Mil/uL (ref 4.22–5.81)
WBC: 2.5 10*3/uL — ABNORMAL LOW (ref 4.5–10.5)

## 2012-05-06 LAB — HEPATIC FUNCTION PANEL
ALT: 13 U/L (ref 0–53)
Albumin: 4 g/dL (ref 3.5–5.2)
Alkaline Phosphatase: 48 U/L (ref 39–117)
Bilirubin, Direct: 0.1 mg/dL (ref 0.0–0.3)
Total Protein: 7 g/dL (ref 6.0–8.3)

## 2012-05-06 LAB — LIPID PANEL
Cholesterol: 159 mg/dL (ref 0–200)
LDL Cholesterol: 109 mg/dL — ABNORMAL HIGH (ref 0–99)
Triglycerides: 62 mg/dL (ref 0.0–149.0)
VLDL: 12.4 mg/dL (ref 0.0–40.0)

## 2012-05-09 NOTE — Telephone Encounter (Signed)
Agree with plan 

## 2012-05-10 ENCOUNTER — Telehealth: Payer: Self-pay | Admitting: Hematology & Oncology

## 2012-05-10 ENCOUNTER — Ambulatory Visit (INDEPENDENT_AMBULATORY_CARE_PROVIDER_SITE_OTHER): Payer: Medicare Other | Admitting: Cardiology

## 2012-05-10 ENCOUNTER — Encounter: Payer: Self-pay | Admitting: Cardiology

## 2012-05-10 ENCOUNTER — Telehealth: Payer: Self-pay | Admitting: *Deleted

## 2012-05-10 VITALS — BP 136/70 | HR 49 | Ht 72.0 in | Wt 184.0 lb

## 2012-05-10 DIAGNOSIS — D72819 Decreased white blood cell count, unspecified: Secondary | ICD-10-CM | POA: Insufficient documentation

## 2012-05-10 DIAGNOSIS — E7800 Pure hypercholesterolemia, unspecified: Secondary | ICD-10-CM

## 2012-05-10 DIAGNOSIS — I259 Chronic ischemic heart disease, unspecified: Secondary | ICD-10-CM

## 2012-05-10 DIAGNOSIS — I119 Hypertensive heart disease without heart failure: Secondary | ICD-10-CM

## 2012-05-10 NOTE — Assessment & Plan Note (Signed)
The patient had routine blood work last week and the white blood  cell count is low at 2500.  The patient is also mildly anemic with hemoglobin less than 10.  His platelet count is normal and is on daily aspirin.  The patient has not had any recent viral illnesses or unexplained fevers.  We will refer the patient to hematology in regard to his leukopenia and anemia

## 2012-05-10 NOTE — Telephone Encounter (Signed)
Received message from Regis Bill for referral. Tried to call back no answer or machine. MD reviewed chart, I left message with patient to call and schedule appointment. MD wants to see him no later than next week.

## 2012-05-10 NOTE — Assessment & Plan Note (Signed)
Blood pressure has been remaining stable on current therapy.  No headaches or dizzy spells.  No symptoms of CHF.

## 2012-05-10 NOTE — Telephone Encounter (Signed)
Left message at Dr Gustavo Lah office to call back.  Referring patient per office visit today for leucopenia and anemia.  Recorder at his office stated if no return call to call back [phone].

## 2012-05-10 NOTE — Assessment & Plan Note (Signed)
The patient has a history of hypercholesterolemia.  He is intolerant of statins.  Presently he is on ezetimibe and fenofibrate

## 2012-05-10 NOTE — Patient Instructions (Addendum)
Your physician recommends that you continue on your current medications as directed. Please refer to the Current Medication list given to you today.  Your physician wants you to follow-up in: 6 months with fasting labs (lp/bmet/hfp/cbc)  You will receive a reminder letter in the mail two months in advance. If you don't receive a letter, please call our office to schedule the follow-up appointment.   Left message with Dr Gustavo Lah office to call and get appointment scheduled

## 2012-05-10 NOTE — Telephone Encounter (Signed)
Pt aware of 05-13-12 appointment

## 2012-05-10 NOTE — Progress Notes (Signed)
Luis Rowe Date of Birth:  [birth year] [facility] [address] [place], [place]  [address] [phone]         Fax   [phone]  History of Present Illness: This pleasant 78 year old gentleman is seen for a scheduled 6 month followup office visit.  He has a history of known ischemic heart disease.  He had stents in 2001 and again in 2003.  His last nuclear stress test on 03/25/10 showed no ischemia and his ejection fraction was 71%.  The patient has a history of hypercholesterolemia and a history of BPH.  He also has a history of osteoarthritis and underwent right total knee replacement in September 2001.  His last visit he has been doing well with no new symptoms  Current Outpatient Prescriptions  Medication Sig Dispense Refill  . aspirin 325 MG EC tablet Take 325 mg by mouth daily.        . Doxylamine Succinate, Sleep, (SLEEP AID PO) Take by mouth. 1/2 daily       . fenofibrate 160 MG tablet Take 1 tablet (160 mg total) by mouth daily.  90 tablet  3  . IBUPROFEN PO Take by mouth. As needed      . metoprolol succinate (TOPROL XL) 50 MG 24 hr tablet Take 1 tablet (50 mg total) by mouth daily.  90 tablet  3  . Multiple Vitamin (MULTIVITAMIN) tablet Take 1 tablet by mouth daily.        . Naproxen Sodium (ALEVE) 220 MG CAPS Take by mouth.        . Omega-3 Fatty Acids (FISH OIL) 1000 MG CAPS Take 1,000 mg by mouth 2 (two) times daily. Taking 1200 daily      . omeprazole (PRILOSEC) 20 MG capsule Take 20 mg by mouth daily.        . polyethylene glycol (MIRALAX / GLYCOLAX) packet Take 17 g by mouth daily.       . Selenium 200 MCG CAPS Take by mouth daily.        . Tamsulosin HCl (FLOMAX) 0.4 MG CAPS Take by mouth daily. Dr. Vonita Moss       . traMADol (ULTRAM) 50 MG tablet Take 1 tablet (50 mg total) by mouth daily.  90 tablet  3  . ZETIA 10 MG tablet TAKE 1 TABLET DAILY  90 tablet  2  . propranolol (INDERAL) 20 MG tablet Take 20 mg by mouth as needed.         Allergies  Allergen Reactions  . Crestor (Rosuvastatin Calcium)     myalgias  . Lipitor (Atorvastatin Calcium)     myalgias  . Zocor (Simvastatin - High Dose)     myalgias    Patient Active Problem List  Diagnosis  . Benign hypertensive heart disease without heart failure  . Hypercholesterolemia  . Ischemic heart disease  . BPH (benign prostatic hyperplasia)  . Arthritis    History  Smoking status  . Former Smoker  . Types: Cigarettes  Smokeless tobacco  . Not on file    History  Alcohol Use: Not on file    No family history on file.  Review of Systems: Constitutional: no fever chills diaphoresis or fatigue or change in weight.  Head and neck: no hearing loss, no epistaxis, no photophobia or visual disturbance. Respiratory: No cough, shortness of breath or wheezing. Cardiovascular: No chest pain peripheral edema, palpitations. Gastrointestinal: No abdominal distention, no abdominal pain, no change in bowel habits hematochezia or melena. Genitourinary:  No dysuria, no frequency, no urgency, no nocturia. Musculoskeletal:No arthralgias, no back pain, no gait disturbance or myalgias. Neurological: No dizziness, no headaches, no numbness, no seizures, no syncope, no weakness, no tremors. Hematologic: No lymphadenopathy, no easy bruising. Psychiatric: No confusion, no hallucinations, no sleep disturbance.    Physical Exam: Filed Vitals:   05/10/12 1202  BP: 136/70  Pulse: 49   the general appearance reveals a well-developed well-nourished elderly gentleman in no distress.  He is mildly pale.The head and neck exam reveals pupils equal and reactive.  Extraocular movements are full.  There is no scleral icterus.  The mouth and pharynx are normal.  The neck is supple.  The carotids reveal no bruits.  The jugular venous pressure is normal.  The  thyroid is not enlarged.  There is no lymphadenopathy.  The chest is clear to percussion and auscultation.  There are no rales  or rhonchi.  Expansion of the chest is symmetrical.  The precordium is quiet.  The first heart sound is normal.  The second heart sound is physiologically split.  There is no murmur gallop rub or click.  There is no abnormal lift or heave.  The abdomen is soft and nontender.  The bowel sounds are normal.  The liver and spleen are not enlarged.  There are no abdominal masses.  There are no abdominal bruits.  Extremities reveal good pedal pulses.  There is no phlebitis or edema.  There is no cyanosis or clubbing.  Strength is normal and symmetrical in all extremities.  There is no lateralizing weakness.  There are no sensory deficits.  The skin is warm and dry.  There is no rash.  EKG today shows marked sinus bradycardia with first degree AV block.  The patient is having no symptoms related to his bradycardia.   Assessment / Plan: Continue same medication.  Referral to hematology regarding his leukopenia and his anemia.  Recheck in 6 months for followup office visit lipid panel hepatic function panel and basal metabolic panel and CBC.

## 2012-05-11 ENCOUNTER — Ambulatory Visit: Payer: Medicare Other | Admitting: Medical

## 2012-05-11 ENCOUNTER — Ambulatory Visit: Payer: Medicare Other | Admitting: Hematology & Oncology

## 2012-05-11 ENCOUNTER — Other Ambulatory Visit: Payer: Medicare Other | Admitting: Lab

## 2012-05-11 ENCOUNTER — Ambulatory Visit: Payer: Medicare Other

## 2012-05-13 ENCOUNTER — Ambulatory Visit: Payer: Medicare Other

## 2012-05-13 ENCOUNTER — Other Ambulatory Visit (HOSPITAL_BASED_OUTPATIENT_CLINIC_OR_DEPARTMENT_OTHER): Payer: Medicare Other | Admitting: Lab

## 2012-05-13 ENCOUNTER — Other Ambulatory Visit: Payer: Medicare Other | Admitting: Lab

## 2012-05-13 ENCOUNTER — Ambulatory Visit: Payer: Medicare Other | Admitting: Hematology & Oncology

## 2012-05-13 ENCOUNTER — Ambulatory Visit (HOSPITAL_BASED_OUTPATIENT_CLINIC_OR_DEPARTMENT_OTHER): Payer: Medicare Other | Admitting: Hematology & Oncology

## 2012-05-13 VITALS — BP 171/61 | HR 56 | Temp 97.7°F | Ht 71.0 in | Wt 186.0 lb

## 2012-05-13 DIAGNOSIS — D61818 Other pancytopenia: Secondary | ICD-10-CM

## 2012-05-13 DIAGNOSIS — D509 Iron deficiency anemia, unspecified: Secondary | ICD-10-CM

## 2012-05-13 DIAGNOSIS — D631 Anemia in chronic kidney disease: Secondary | ICD-10-CM

## 2012-05-13 DIAGNOSIS — D649 Anemia, unspecified: Secondary | ICD-10-CM

## 2012-05-13 DIAGNOSIS — D72819 Decreased white blood cell count, unspecified: Secondary | ICD-10-CM

## 2012-05-13 DIAGNOSIS — N289 Disorder of kidney and ureter, unspecified: Secondary | ICD-10-CM

## 2012-05-13 DIAGNOSIS — N189 Chronic kidney disease, unspecified: Secondary | ICD-10-CM

## 2012-05-13 DIAGNOSIS — Z87891 Personal history of nicotine dependence: Secondary | ICD-10-CM

## 2012-05-13 LAB — CBC WITH DIFFERENTIAL (CANCER CENTER ONLY)
BASO#: 0 10*3/uL (ref 0.0–0.2)
EOS%: 3.8 % (ref 0.0–7.0)
HCT: 32.7 % — ABNORMAL LOW (ref 38.7–49.9)
HGB: 10 g/dL — ABNORMAL LOW (ref 13.0–17.1)
LYMPH%: 19.7 % (ref 14.0–48.0)
MCH: 22.6 pg — ABNORMAL LOW (ref 28.0–33.4)
MCHC: 30.6 g/dL — ABNORMAL LOW (ref 32.0–35.9)
MCV: 74 fL — ABNORMAL LOW (ref 82–98)
MONO%: 12.9 % (ref 0.0–13.0)
NEUT%: 63.2 % (ref 40.0–80.0)

## 2012-05-13 NOTE — Progress Notes (Signed)
This office note has been dictated.

## 2012-05-13 NOTE — Telephone Encounter (Signed)
Patient saw Dr Myna Hidalgo today

## 2012-05-13 NOTE — Progress Notes (Signed)
CC:   Luis Rowe, M.D.  DIAGNOSES: 1. Thrombocytopenia. 2. Microcytic anemia. 3. Mild renal insufficiency.  HISTORY OF PRESENT ILLNESS:  Luis Rowe is a very nice 78 year old white gentleman.  He is retired.  He worked for [employer].  He was in the [employer]. He travels quite a bit.  He is followed Dr. Patty Sermons for general care and also for heart issues. He is on several medications.  He said he has not had any new medications.  Luis Rowe did used to give blood quite a bit.  He said he has not given blood for a couple years because his blood counts were too low.  Dr. Patty Sermons has noticed that his blood counts have been trending downward.  As such, he kindly referred Luis Rowe to the [facility] for an evaluation.  Luis Rowe has not lost weight or gained weight.  He has noticed any change in bowel or bladder habits.  His last colonoscopy was 7 years ago.  He has had no cough.  He has had no headache.  There have been no swallowing difficulties.  He has had no problem with recurrent infections.  He has not noticed any kind of rashes.  Going back to September 2011, his white cell count was 4, hemoglobin 7.9, hematocrit 22.9, platelet count was 161.  His MCV was 83.  In January of this year, his CBC showed a white cell count 2.8, hemoglobin 10.1, hematocrit 30.7, platelet count is 180.  MCV was down 74.  His last CBC that I have from 05/06/2012 shows white cell count 2.5, hemoglobin 9.9, hematocrit 31.1, platelet count is 192.  MCV is down to 71.  He has mild renal insufficiency.  His BUN is 33, creatinine 1.6.  His creatinine clearance is 45 mL/minute.  He has normal LFTs.  Again, he is kindly referred to [facility] for evaluation.  PAST MEDICAL HISTORY:  Remarkable for: 1. Coronary artery disease. 2. Hypercholesterolemia. 3. BPH. 4. Arthritis.  ALLERGIES:  Crestor, Lipitor, Zocor.  MEDICATIONS:  Zetia 10 mg p.o. daily,  Inderal 20 mg p.o. as needed, Ultram 50 mg q.8 hours p.r.n., Flomax 0.4 mg p.o. daily, Prilosec 20 mg p.o. daily, Toprol-XL 50 mg p.o. daily, fenofibrate 160 mg p.o. daily, aspirin 325 mg p.o. daily.  SOCIAL HISTORY:  Remarkable for past tobacco use.  He stopped in 1974. He probably had about a 40-pack-year history of tobacco use.  He has social alcohol use with maybe 1 drink daily.  FAMILY HISTORY:  Really noncontributory.  REVIEW OF SYSTEMS:  As stated in the history of present illness.  No additional findings noted on a 12-system review.  PHYSICAL EXAMINATION:  This is an elderly white gentleman in no obvious distress.  Vital signs:  Temperature of 97.7, pulse 56, respiratory rate 20, blood pressure 131/61.  Weight is 186.  Head and neck: Normocephalic, atraumatic skull.  There are no ocular or oral lesions. There are no palpable cervical or supraclavicular lymph nodes.  He has no scleral icterus.  Thyroid is nonpalpable.  Lungs:  With some crackles in the bases.  He has no wheezes.  He has decent air movement bilaterally.  Cardiac:  Regular rate and rhythm with a normal S1, S2. There are no murmurs, rubs or bruits.  Abdomen:  Soft with good bowel sounds.  There is no fluid wave.  There is no palpable hepatosplenomegaly.  Rectal:  Shows no rectal masses.  He has no external hemorrhoids.  Stool is brown and heme negative.  Extremities: No clubbing, cyanosis or edema.  He has a surgical scar in his left knee.  He has good range of motion of his joints.  He has good pulses in his distal extremities.  Back:  Does show actinic keratosis possibly squamous cell carcinoma in the left back.  There is no tenderness over the spine, ribs, or hips.  Neurologic:  No focal neurological deficits. Skin:  Shows no ecchymosis or petechia.  LABORATORY STUDIES:  White cell count of 2.6, hemoglobin 10, hematocrit 33, platelet count 182.  MCV is 74.  Peripheral smear shows a normochromic, normocytic  population of red blood cells.  There are some microcytic red cells.  He has no nucleated red blood cells.  I see no rouleaux formation.  There are no teardrop cells.  He has no target cells.  I see no spherocytes or schistocytes.  White cells are decreased in number.  He has good maturation of his white blood cells.  There are no hypersegmented polys.  There are no immature myeloid cells.  I see no atypical lymphocytes.  Platelets are adequate in number and size.  He has numerous large platelets that are well granulated.  IMPRESSION:  Luis Rowe is an 78 year old gentleman with anemia and leukopenia.  One would have to believe that there is some element of myelodysplasia here.  At his age, one would have to think that myelodysplasia is the most likely possibility for the leukopenia and anemia.  The fact that his MCV is low, I am going to check his iron studies.  Even though he has not given blood in a couple of years, he could certainly still have some degree of iron deficiency.  If so, we will go ahead and give him some IV iron.  He does have renal insufficiency.  As such, I am checking an erythropoietin level on him.  I suspect that his erythropoietin level might be on the low side also.  I do not see a need for a bone marrow test on him right now.  He is asymptomatic for the most part.  His blood smear certainly does not show anything that would be considered high-grade myelodysplasia.  I think we are going to try to correct his anemia with iron and/or ESA. Again, I suspect that his erythropoietin level is going to be on the low side.  He does not need a colonoscopy from my point of view.  Even if he is iron deficient, the fact that his stools were heme negative, I just think an endoscopy would be very low yield.  We spent a good hour or so with Luis Rowe.  He is a real good guy.  It was nice talking to him.  I gave him a copy of his lab work.  I explained to him my  recommendations.  He fully understands.  We will plan to have Luis Rowe come back to see [place] once I get his lab results back and we will come up with a "game plan" for trying to improve his anemia.    ______________________________ Josph Macho, M.D. PRE/MEDQ  D:  05/13/2012  T:  05/13/2012  Job:  [id number]

## 2012-05-14 LAB — RETICULOCYTES (CHCC)
RBC.: 4.53 MIL/uL (ref 4.22–5.81)
Retic Ct Pct: 1.5 % (ref 0.4–2.3)

## 2012-05-14 LAB — IRON AND TIBC: UIBC: 462 ug/dL — ABNORMAL HIGH (ref 125–400)

## 2012-05-14 LAB — FERRITIN: Ferritin: 7 ng/mL — ABNORMAL LOW (ref 22–322)

## 2012-05-14 LAB — ERYTHROPOIETIN: Erythropoietin: 62.4 m[IU]/mL — ABNORMAL HIGH (ref 2.6–34.0)

## 2012-05-17 ENCOUNTER — Other Ambulatory Visit: Payer: Self-pay | Admitting: Hematology & Oncology

## 2012-05-17 ENCOUNTER — Encounter: Payer: Self-pay | Admitting: Hematology & Oncology

## 2012-05-17 DIAGNOSIS — D509 Iron deficiency anemia, unspecified: Secondary | ICD-10-CM

## 2012-05-17 HISTORY — DX: Iron deficiency anemia, unspecified: D50.9

## 2012-05-18 ENCOUNTER — Telehealth: Payer: Self-pay | Admitting: Hematology & Oncology

## 2012-05-18 NOTE — Telephone Encounter (Signed)
Per order to sch patient for Iron appt.  Appt was sch for 05/26/12.  I called and left message of appt date/time and mailed out calendar.

## 2012-05-20 ENCOUNTER — Other Ambulatory Visit: Payer: Self-pay | Admitting: *Deleted

## 2012-05-20 ENCOUNTER — Telehealth: Payer: Self-pay | Admitting: *Deleted

## 2012-05-20 DIAGNOSIS — D509 Iron deficiency anemia, unspecified: Secondary | ICD-10-CM

## 2012-05-20 NOTE — Telephone Encounter (Signed)
Called patient to let him know that his iron levels were very low  Needs one dose of Feraheme 1020 mg.  Patient already has infusion appt on 05/26/12 will get it then

## 2012-05-20 NOTE — Telephone Encounter (Signed)
Message copied by Anselm Jungling on Thu May 20, 2012 10:09 AM ------      Message from: Arlan Organ R      Created: Mon May 17, 2012  5:06 PM       Call - iron is very low!!  Need to give IV FeraHeme - 1020mg .  Please set this up!!!  Thanks!! Cindee Lame

## 2012-05-26 ENCOUNTER — Ambulatory Visit (HOSPITAL_BASED_OUTPATIENT_CLINIC_OR_DEPARTMENT_OTHER): Payer: Medicare Other

## 2012-05-26 VITALS — BP 155/59 | HR 49 | Temp 97.0°F | Resp 20

## 2012-05-26 DIAGNOSIS — D509 Iron deficiency anemia, unspecified: Secondary | ICD-10-CM

## 2012-05-26 MED ORDER — SODIUM CHLORIDE 0.9 % IJ SOLN
3.0000 mL | Freq: Once | INTRAMUSCULAR | Status: DC | PRN
  Filled 2012-05-26: qty 10

## 2012-05-26 MED ORDER — SODIUM CHLORIDE 0.9 % IV SOLN
Freq: Once | INTRAVENOUS | Status: AC
  Administered 2012-05-26: 10:00:00 via INTRAVENOUS

## 2012-05-26 MED ORDER — HEPARIN SOD (PORK) LOCK FLUSH 100 UNIT/ML IV SOLN
500.0000 [IU] | Freq: Once | INTRAVENOUS | Status: DC | PRN
  Filled 2012-05-26: qty 5

## 2012-05-26 MED ORDER — SODIUM CHLORIDE 0.9 % IJ SOLN
10.0000 mL | INTRAMUSCULAR | Status: DC | PRN
  Filled 2012-05-26: qty 10

## 2012-05-26 MED ORDER — SODIUM CHLORIDE 0.9 % IV SOLN
1020.0000 mg | Freq: Once | INTRAVENOUS | Status: AC
  Administered 2012-05-26: 1020 mg via INTRAVENOUS
  Filled 2012-05-26: qty 34

## 2012-05-26 MED ORDER — HEPARIN SOD (PORK) LOCK FLUSH 100 UNIT/ML IV SOLN
250.0000 [IU] | Freq: Once | INTRAVENOUS | Status: DC | PRN
  Filled 2012-05-26: qty 5

## 2012-05-26 MED ORDER — ALTEPLASE 2 MG IJ SOLR
2.0000 mg | Freq: Once | INTRAMUSCULAR | Status: DC | PRN
  Filled 2012-05-26: qty 2

## 2012-05-26 NOTE — Patient Instructions (Signed)
Ferumoxytol injection What is this medicine? FERUMOXYTOL is an iron complex. Iron is used to make healthy red blood cells, which carry oxygen and nutrients throughout the body. This medicine is used to treat iron deficiency anemia in people with chronic kidney disease. This medicine may be used for other purposes; ask your health care provider or pharmacist if you have questions. What should I tell my health care provider before I take this medicine? They need to know if you have any of these conditions: -anemia not caused by low iron levels -high levels of iron in the blood -magnetic resonance imaging (MRI) test scheduled -an unusual or allergic reaction to iron, other medicines, foods, dyes, or preservatives -pregnant or trying to get pregnant -breast-feeding How should I use this medicine? This medicine is for infusion into a vein. It is given by a health care professional in a hospital or clinic setting. Talk to your pediatrician regarding the use of this medicine in children. Special care may be needed. Overdosage: If you think you've taken too much of this medicine contact a poison control center or emergency room at once. Overdosage: If you think you have taken too much of this medicine contact a poison control center or emergency room at once. NOTE: This medicine is only for you. Do not share this medicine with others. What if I miss a dose? It is important not to miss your dose. Call your doctor or health care professional if you are unable to keep an appointment. What may interact with this medicine? This medicine may interact with the following medications: -other iron products This list may not describe all possible interactions. Give your health care provider a list of all the medicines, herbs, non-prescription drugs, or dietary supplements you use. Also tell them if you smoke, drink alcohol, or use illegal drugs. Some items may interact with your medicine. What should I watch  for while using this medicine? Visit your doctor or healthcare professional regularly. Tell your doctor or healthcare professional if your symptoms do not start to get better or if they get worse. You may need blood work done while you are taking this medicine. You may need to follow a special diet. Talk to your doctor. Foods that contain iron include: whole grains/cereals, dried fruits, beans, or peas, leafy green vegetables, and organ meats (liver, kidney). What side effects may I notice from receiving this medicine? Side effects that you should report to your doctor or health care professional as soon as possible: -allergic reactions like skin rash, itching or hives, swelling of the face, lips, or tongue -breathing problems -changes in blood pressure -feeling faint or lightheaded, falls -fever or chills -flushing, sweating, or hot feelings -swelling of the ankles or feet Side effects that usually do not require medical attention (Report these to your doctor or health care professional if they continue or are bothersome.): -diarrhea -headache -nausea, vomiting -stomach pain This list may not describe all possible side effects. Call your doctor for medical advice about side effects. You may report side effects to FDA at 1-800-FDA-1088. Where should I keep my medicine? This drug is given in a hospital or clinic and will not be stored at home. NOTE: This sheet is a summary. It may not cover all possible information. If you have questions about this medicine, talk to your doctor, pharmacist, or health care provider.  2012, Elsevier/Gold Standard. (06/28/2008 9:48:25 PM) 

## 2012-07-01 ENCOUNTER — Other Ambulatory Visit (HOSPITAL_BASED_OUTPATIENT_CLINIC_OR_DEPARTMENT_OTHER): Payer: Medicare Other | Admitting: Lab

## 2012-07-01 ENCOUNTER — Ambulatory Visit (HOSPITAL_BASED_OUTPATIENT_CLINIC_OR_DEPARTMENT_OTHER): Payer: Medicare Other | Admitting: Hematology & Oncology

## 2012-07-01 VITALS — BP 185/70 | HR 60 | Temp 97.6°F | Resp 20 | Ht 70.0 in | Wt 185.0 lb

## 2012-07-01 DIAGNOSIS — D72819 Decreased white blood cell count, unspecified: Secondary | ICD-10-CM

## 2012-07-01 DIAGNOSIS — D509 Iron deficiency anemia, unspecified: Secondary | ICD-10-CM

## 2012-07-01 DIAGNOSIS — D696 Thrombocytopenia, unspecified: Secondary | ICD-10-CM

## 2012-07-01 DIAGNOSIS — N289 Disorder of kidney and ureter, unspecified: Secondary | ICD-10-CM

## 2012-07-01 LAB — CBC WITH DIFFERENTIAL (CANCER CENTER ONLY)
BASO#: 0 10*3/uL (ref 0.0–0.2)
Eosinophils Absolute: 0.2 10*3/uL (ref 0.0–0.5)
HCT: 37.5 % — ABNORMAL LOW (ref 38.7–49.9)
HGB: 12.2 g/dL — ABNORMAL LOW (ref 13.0–17.1)
LYMPH%: 25.6 % (ref 14.0–48.0)
MCH: 26.2 pg — ABNORMAL LOW (ref 28.0–33.4)
MCV: 81 fL — ABNORMAL LOW (ref 82–98)
MONO%: 11 % (ref 0.0–13.0)
RBC: 4.66 10*6/uL (ref 4.20–5.70)

## 2012-07-01 LAB — RETICULOCYTES (CHCC)
ABS Retic: 61.8 10*3/uL (ref 19.0–186.0)
Retic Ct Pct: 1.3 % (ref 0.4–2.3)

## 2012-07-01 LAB — FERRITIN: Ferritin: 99 ng/mL (ref 22–322)

## 2012-07-01 LAB — IRON AND TIBC: UIBC: 299 ug/dL (ref 125–400)

## 2012-07-01 NOTE — Progress Notes (Signed)
This office note has been dictated.

## 2012-07-01 NOTE — Patient Instructions (Addendum)
Call with any bleedeing

## 2012-07-02 NOTE — Progress Notes (Signed)
CC:   Cassell Clement, M.D.  DIAGNOSES: 1. Thrombocytopenia/leukopenia. 2. Iron-deficiency anemia.  CURRENT THERAPY:  Patient status post IV iron.  INTERIM HISTORY:  Mr. Luis Rowe comes in for followup.  We initially saw him back in July.  At that point in time, we found that his ferritin was only 7.  The iron saturation was only 6%.  We did go ahead and give him a dose of IV iron.  Outside of that, his other lab work looked okay. His erythropoietin level was 62.  He does have some mild renal insufficiency.  Since we saw him, he has felt a little bit better.  He says the IV iron did seem to give him a little bit more energy.  He got a dose of Feraheme 1020 mg on 05/26/2012.  PHYSICAL EXAMINATION:  This is an elderly white gentleman in no obvious distress.  Vital signs:  Temperature of 97.6, pulse 60, respiratory rate 20, blood pressure 185/70.  Weight is 185.  Head and neck: Normocephalic, atraumatic skull.  There are no ocular or oral lesions. There are no palpable cervical or supraclavicular lymph nodes.  Lungs: Clear bilaterally.  Cardiac:  Regular rate and rhythm with a normal S1 and S2.  There are no murmurs, rubs or bruits.  Abdomen:  Soft with good bowel sounds.  There is no palpable abdominal mass.  No palpable hepatosplenomegaly.  Extremities:  No clubbing, cyanosis or edema. Skin:  No rashes, ecchymoses or petechia.  LABORATORY STUDIES:  White cell count is 2.5, hemoglobin 12.2, hematocrit 37.5, platelet count 143, MCV is 81.  IMPRESSION:  Mr. Durden is an 78 year old gentleman with mild leukopenia and thrombocytopenia.  Again, I looked at his blood smear.  I do not see anything that was unusual with his white cells.  I suppose he may have some degree of myelodysplasia.  He is asymptomatic.  I do not see that we have to do a bone marrow biopsy on him right now.  We will see what his iron levels are.  I will then plan to get him back to see me probably after the  holidays.  I think we can probably wait to see him then.   ______________________________ Josph Macho, M.D. PRE/MEDQ  D:  07/01/2012  T:  07/02/2012  Job:  [id number]

## 2012-07-05 ENCOUNTER — Telehealth: Payer: Self-pay | Admitting: *Deleted

## 2012-07-05 ENCOUNTER — Ambulatory Visit (HOSPITAL_BASED_OUTPATIENT_CLINIC_OR_DEPARTMENT_OTHER): Payer: Medicare Other

## 2012-07-05 ENCOUNTER — Other Ambulatory Visit: Payer: Self-pay | Admitting: *Deleted

## 2012-07-05 VITALS — BP 165/67 | HR 49 | Temp 96.8°F | Resp 18

## 2012-07-05 DIAGNOSIS — D509 Iron deficiency anemia, unspecified: Secondary | ICD-10-CM

## 2012-07-05 MED ORDER — FERUMOXYTOL INJECTION 510 MG/17 ML
1020.0000 mg | Freq: Once | INTRAVENOUS | Status: AC
  Administered 2012-07-05: 1020 mg via INTRAVENOUS
  Filled 2012-07-05: qty 34

## 2012-07-05 MED ORDER — SODIUM CHLORIDE 0.9 % IV SOLN
Freq: Once | INTRAVENOUS | Status: AC
  Administered 2012-07-05: 14:00:00 via INTRAVENOUS

## 2012-07-05 NOTE — Telephone Encounter (Signed)
Called patient to let him know that his iron is better but still low per dr. Myna Hidalgo.  Needs one more dose of Feraheme 1020 mg.  Patient to come today at 2p.

## 2012-07-05 NOTE — Patient Instructions (Signed)
Ferumoxytol injection What is this medicine? FERUMOXYTOL is an iron complex. Iron is used to make healthy red blood cells, which carry oxygen and nutrients throughout the body. This medicine is used to treat iron deficiency anemia in people with chronic kidney disease. This medicine may be used for other purposes; ask your health care provider or pharmacist if you have questions. What should I tell my health care provider before I take this medicine? They need to know if you have any of these conditions: -anemia not caused by low iron levels -high levels of iron in the blood -magnetic resonance imaging (MRI) test scheduled -an unusual or allergic reaction to iron, other medicines, foods, dyes, or preservatives -pregnant or trying to get pregnant -breast-feeding How should I use this medicine? This medicine is for infusion into a vein. It is given by a health care professional in a hospital or clinic setting. Talk to your pediatrician regarding the use of this medicine in children. Special care may be needed. Overdosage: If you think you've taken too much of this medicine contact a poison control center or emergency room at once. Overdosage: If you think you have taken too much of this medicine contact a poison control center or emergency room at once. NOTE: This medicine is only for you. Do not share this medicine with others. What if I miss a dose? It is important not to miss your dose. Call your doctor or health care professional if you are unable to keep an appointment. What may interact with this medicine? This medicine may interact with the following medications: -other iron products This list may not describe all possible interactions. Give your health care provider a list of all the medicines, herbs, non-prescription drugs, or dietary supplements you use. Also tell them if you smoke, drink alcohol, or use illegal drugs. Some items may interact with your medicine. What should I watch  for while using this medicine? Visit your doctor or healthcare professional regularly. Tell your doctor or healthcare professional if your symptoms do not start to get better or if they get worse. You may need blood work done while you are taking this medicine. You may need to follow a special diet. Talk to your doctor. Foods that contain iron include: whole grains/cereals, dried fruits, beans, or peas, leafy green vegetables, and organ meats (liver, kidney). What side effects may I notice from receiving this medicine? Side effects that you should report to your doctor or health care professional as soon as possible: -allergic reactions like skin rash, itching or hives, swelling of the face, lips, or tongue -breathing problems -changes in blood pressure -feeling faint or lightheaded, falls -fever or chills -flushing, sweating, or hot feelings -swelling of the ankles or feet Side effects that usually do not require medical attention (Report these to your doctor or health care professional if they continue or are bothersome.): -diarrhea -headache -nausea, vomiting -stomach pain This list may not describe all possible side effects. Call your doctor for medical advice about side effects. You may report side effects to FDA at 1-800-FDA-1088. Where should I keep my medicine? This drug is given in a hospital or clinic and will not be stored at home. NOTE: This sheet is a summary. It may not cover all possible information. If you have questions about this medicine, talk to your doctor, pharmacist, or health care provider.  2012, Elsevier/Gold Standard. (06/28/2008 9:48:25 PM) 

## 2012-07-05 NOTE — Telephone Encounter (Signed)
Message copied by Anselm Jungling on Mon Jul 05, 2012  9:31 AM ------      Message from: Arlan Organ R      Created: Thu Jul 01, 2012  8:33 PM       Call - Iron is better but still low. Need one more dose of Feraheme at 1020mg . Please set up.  Cindee Lame

## 2012-10-22 ENCOUNTER — Other Ambulatory Visit: Payer: Self-pay | Admitting: Cardiology

## 2012-10-22 MED ORDER — METOPROLOL SUCCINATE ER 50 MG PO TB24: 50.0000 mg | ORAL_TABLET | Freq: Every day | ORAL | Status: DC

## 2012-10-27 ENCOUNTER — Other Ambulatory Visit (HOSPITAL_BASED_OUTPATIENT_CLINIC_OR_DEPARTMENT_OTHER): Payer: Medicare Other | Admitting: Lab

## 2012-10-27 ENCOUNTER — Ambulatory Visit (HOSPITAL_BASED_OUTPATIENT_CLINIC_OR_DEPARTMENT_OTHER): Payer: Medicare Other | Admitting: Hematology & Oncology

## 2012-10-27 VITALS — BP 142/62 | HR 57 | Temp 97.6°F | Resp 18 | Ht 70.0 in | Wt 185.0 lb

## 2012-10-27 DIAGNOSIS — D509 Iron deficiency anemia, unspecified: Secondary | ICD-10-CM

## 2012-10-27 DIAGNOSIS — D649 Anemia, unspecified: Secondary | ICD-10-CM

## 2012-10-27 LAB — CBC WITH DIFFERENTIAL (CANCER CENTER ONLY)
Eosinophils Absolute: 0.1 10*3/uL (ref 0.0–0.5)
HGB: 12.9 g/dL — ABNORMAL LOW (ref 13.0–17.1)
LYMPH%: 22.8 % (ref 14.0–48.0)
MCV: 91 fL (ref 82–98)
MONO#: 0.2 10*3/uL (ref 0.1–0.9)
NEUT#: 1.7 10*3/uL (ref 1.5–6.5)
Platelets: 162 10*3/uL (ref 145–400)
RBC: 4.3 10*6/uL (ref 4.20–5.70)
WBC: 2.7 10*3/uL — ABNORMAL LOW (ref 4.0–10.0)

## 2012-10-27 LAB — IRON AND TIBC
%SAT: 24 % (ref 20–55)
Iron: 77 ug/dL (ref 42–165)
UIBC: 243 ug/dL (ref 125–400)

## 2012-10-27 LAB — CHCC SATELLITE - SMEAR

## 2012-10-27 NOTE — Progress Notes (Signed)
This office note has been dictated.

## 2012-10-28 NOTE — Progress Notes (Signed)
CC:   Luis Rowe, M.D.  DIAGNOSES: 1. Chronic leukopenia. 2. Intermittent thrombocytopenia. 3. Iron deficiency anemia.  CURRENT THERAPY:  Observation.  INTERIM HISTORY:  Luis Rowe comes in for followup.  He did well himself since we last saw him.  Unfortunately, an older brother passed away on New Year's Eve.  Luis Rowe had been up in [place]. 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  I am not sure which one he has. Apparently there is a strong family history of breast cancer.  Luis Rowe, being 79 years old, really does not need any special studies from my point of view.  He says that his PSA has never been an issue.  We did have to give her some iron since we last saw him.  He has done well with iron.  He does feel better. He has more energy.  He has good appetite.  He has had no change in bowel or bladder habits.  There has been no leg swelling.  He has had no rashes.  PHYSICAL EXAMINATION:  General:  This is a well-developed, well- nourished, elderly gentleman in no obvious distress.  Vital signs: Temperature of 97.6, pulse 57, respiratory rate 18, blood pressure 142/63.  Weight is 185.  Head and Neck Exam:  Normocephalic, atraumatic skull.  There are no ocular or oral lesions.  There is no scleral icterus.  There is no palpable adenopathy in the neck.  Lungs:  Clear to percussion and auscultation bilaterally.  Cardiac Exam:  Regular rate and rhythm with normal S1, S2.  There are no murmurs, rubs or bruits. Abdominal Exam:  Soft with good bowel sounds.  There is no palpable abdominal mass.  There is no fluid wave.  There is no palpable hepatosplenomegaly.  Extremities:  Shows some osteoarthritic changes in his joints.  There is no edema in his legs.  He has good range motion of his  joints.  Skin Exam:  No rashes, ecchymoses or petechia.  LABORATORY STUDIES:  White cell count 2.7, hemoglobin 12.9, hematocrit 38.9, platelet count 162.  MCV is  91.  IMPRESSION:  Luis Rowe is an 79 year old gentleman with multifactorial hematologic issues.  These pretty much are stable or improved.  He last got iron back on September 16th.  Again, this has really helped him.  I think we need to probably get him back in 6 months' time now.  I do not see that we need any blood work in between visits.  I think his white cell count is a chronic benign-type of issue.  His blood smear has never shown any significant white cell morphologic changes.    ______________________________ Luis Rowe, M.D. PRE/MEDQ  D:  10/27/2012  T:  10/28/2012  Job:  [id number]

## 2012-12-14 ENCOUNTER — Other Ambulatory Visit (INDEPENDENT_AMBULATORY_CARE_PROVIDER_SITE_OTHER): Payer: Medicare Other

## 2012-12-14 DIAGNOSIS — I119 Hypertensive heart disease without heart failure: Secondary | ICD-10-CM

## 2012-12-14 DIAGNOSIS — E7800 Pure hypercholesterolemia, unspecified: Secondary | ICD-10-CM

## 2012-12-14 LAB — CBC WITH DIFFERENTIAL/PLATELET
Basophils Absolute: 0 10*3/uL (ref 0.0–0.1)
Eosinophils Relative: 2.9 % (ref 0.0–5.0)
Hemoglobin: 12.6 g/dL — ABNORMAL LOW (ref 13.0–17.0)
Lymphocytes Relative: 22.7 % (ref 12.0–46.0)
Monocytes Relative: 7.7 % (ref 3.0–12.0)
Neutro Abs: 1.7 10*3/uL (ref 1.4–7.7)
RBC: 4.28 Mil/uL (ref 4.22–5.81)
RDW: 12.7 % (ref 11.5–14.6)
WBC: 2.6 10*3/uL — ABNORMAL LOW (ref 4.5–10.5)

## 2012-12-14 LAB — BASIC METABOLIC PANEL
Calcium: 9.1 mg/dL (ref 8.4–10.5)
GFR: 49.18 mL/min — ABNORMAL LOW (ref >60.00)
Glucose, Bld: 90 mg/dL (ref 70–99)
Potassium: 4.5 mEq/L (ref 3.5–5.1)
Sodium: 140 mEq/L (ref 135–145)

## 2012-12-14 LAB — LIPID PANEL
Cholesterol: 138 mg/dL (ref 0–200)
HDL: 28.8 mg/dL — ABNORMAL LOW (ref >39.00)
Triglycerides: 61 mg/dL (ref 0.0–149.0)
VLDL: 12.2 mg/dL (ref 0.0–40.0)

## 2012-12-14 LAB — HEPATIC FUNCTION PANEL
ALT: 17 U/L (ref 0–53)
AST: 23 U/L (ref 0–37)
Albumin: 3.9 g/dL (ref 3.5–5.2)
Total Protein: 7 g/dL (ref 6.0–8.3)

## 2012-12-15 NOTE — Progress Notes (Signed)
Quick Note:  Please make copy of labs for patient visit. ______ 

## 2012-12-16 ENCOUNTER — Encounter: Payer: Self-pay | Admitting: Cardiology

## 2012-12-16 ENCOUNTER — Ambulatory Visit (INDEPENDENT_AMBULATORY_CARE_PROVIDER_SITE_OTHER): Payer: Medicare Other | Admitting: Cardiology

## 2012-12-16 VITALS — BP 126/72 | HR 60 | Ht 72.0 in | Wt 186.2 lb

## 2012-12-16 DIAGNOSIS — I119 Hypertensive heart disease without heart failure: Secondary | ICD-10-CM

## 2012-12-16 DIAGNOSIS — I259 Chronic ischemic heart disease, unspecified: Secondary | ICD-10-CM

## 2012-12-16 DIAGNOSIS — E7800 Pure hypercholesterolemia, unspecified: Secondary | ICD-10-CM

## 2012-12-16 DIAGNOSIS — D509 Iron deficiency anemia, unspecified: Secondary | ICD-10-CM

## 2012-12-16 NOTE — Progress Notes (Signed)
Allegra Lai Date of Birth:  Nov 26, 1925 [facility] [address] [place], [place]  [address] [phone]         Fax   [phone]  History of Present Illness: This pleasant 79 year old gentleman is seen for a scheduled 6 month followup office visit. He has a history of known ischemic heart disease. He had stents in 2001 and again in 2003. His last nuclear stress test on 03/25/10 showed no ischemia and his ejection fraction was 71%. The patient has a history of hypercholesterolemia and a history of BPH. He also has a history of osteoarthritis and underwent right total knee replacement in September 2001. His last visit he has been doing well with no new symptoms    Current Outpatient Prescriptions  Medication Sig Dispense Refill  . aspirin 325 MG EC tablet Take 325 mg by mouth daily.        . Doxylamine Succinate, Sleep, (SLEEP AID PO) Take by mouth. 1/2 daily       . fenofibrate 160 MG tablet Take 1 tablet (160 mg total) by mouth daily.  90 tablet  3  . metoprolol succinate (TOPROL XL) 50 MG 24 hr tablet Take 1 tablet (50 mg total) by mouth daily.  90 tablet  3  . Multiple Vitamin (MULTIVITAMIN) tablet Take 1 tablet by mouth daily.        . Naproxen Sodium (ALEVE) 220 MG CAPS Take by mouth.        . Omega-3 Fatty Acids (FISH OIL) 1000 MG CAPS Take 1,000 mg by mouth 2 (two) times daily. Taking 1200 daily      . omeprazole (PRILOSEC) 20 MG capsule Take 20 mg by mouth daily.        . polyethylene glycol (MIRALAX / GLYCOLAX) packet Take 17 g by mouth daily.       . Selenium 200 MCG CAPS Take by mouth daily.        . Tamsulosin HCl (FLOMAX) 0.4 MG CAPS Take by mouth 2 (two) times daily. Dr. Vonita Moss      . traMADol (ULTRAM) 50 MG tablet Take 1 tablet (50 mg total) by mouth daily.  90 tablet  3  . ZETIA 10 MG tablet TAKE 1 TABLET DAILY  90 tablet  2   No current facility-administered medications for this visit.    Allergies  Allergen Reactions  . Crestor  (Rosuvastatin Calcium)     myalgias  . Lipitor (Atorvastatin Calcium)     myalgias  . Zocor (Simvastatin - High Dose)     myalgias    Patient Active Problem List  Diagnosis  . Benign hypertensive heart disease without heart failure  . Hypercholesterolemia  . Ischemic heart disease  . BPH (benign prostatic hyperplasia)  . Arthritis  . Leukopenia  . Anemia, iron deficiency    History  Smoking status  . Former Smoker  . Types: Cigarettes  Smokeless tobacco  . Not on file    History  Alcohol Use: Not on file    No family history on file.  Review of Systems: Constitutional: no fever chills diaphoresis or fatigue or change in weight.  Head and neck: no hearing loss, no epistaxis, no photophobia or visual disturbance. Respiratory: No cough, shortness of breath or wheezing. Cardiovascular: No chest pain peripheral edema, palpitations. Gastrointestinal: No abdominal distention, no abdominal pain, no change in bowel habits hematochezia or melena. Genitourinary: No dysuria, no frequency, no urgency, no nocturia. Musculoskeletal:No arthralgias, no back pain, no gait disturbance  or myalgias. Neurological: No dizziness, no headaches, no numbness, no seizures, no syncope, no weakness, no tremors. Hematologic: No lymphadenopathy, no easy bruising. Psychiatric: No confusion, no hallucinations, no sleep disturbance.    Physical Exam: Filed Vitals:   12/16/12 1403  BP: 126/72  Pulse: 60   the general appearance reveals a tall erect alert gentleman in no distress.The head and neck exam reveals pupils equal and reactive.  Extraocular movements are full.  There is no scleral icterus.  The mouth and pharynx are normal.  The neck is supple.  The carotids reveal no bruits.  The jugular venous pressure is normal.  The  thyroid is not enlarged.  There is no lymphadenopathy.  The chest is clear to percussion and auscultation.  There are no rales or rhonchi.  Expansion of the chest is  symmetrical.  The precordium is quiet.  The first heart sound is normal.  The second heart sound is physiologically split.  There is no murmur gallop rub or click.  There is no abnormal lift or heave.  The abdomen is soft and nontender.  The bowel sounds are normal.  The liver and spleen are not enlarged.  There are no abdominal masses.  There are no abdominal bruits.  Extremities reveal good pedal pulses.  There is no phlebitis or edema.  There is no cyanosis or clubbing.  Strength is normal and symmetrical in all extremities.  There is no lateralizing weakness.  There are no sensory deficits.  The skin is warm and dry.  There is no rash.     Assessment / Plan: Continue same medication.  Recheck in 6 months for followup office visit lipid panel hepatic function panel and basal metabolic panel

## 2012-12-16 NOTE — Assessment & Plan Note (Signed)
The patient has a history of chronic anemia and is followed by oncology

## 2012-12-16 NOTE — Patient Instructions (Signed)
Your physician recommends that you continue on your current medications as directed. Please refer to the Current Medication list given to you today.  Your physician wants you to follow-up in: 6 months with fasting labs (lp/bmet/hfp)  You will receive a reminder letter in the mail two months in advance. If you don't receive a letter, please call our office to schedule the follow-up appointment.  

## 2012-12-16 NOTE — Assessment & Plan Note (Signed)
Blood pressure has been staying stable on current therapy.  He is now on a double dose of tamsulosin which has helped his nocturia and has not adversely affected his blood pressure.  The patient has not been having any TIA or stroke symptoms and no symptoms of CHF

## 2012-12-16 NOTE — Assessment & Plan Note (Signed)
The patient is intolerant of statins and is on fenofibrate and ezetimibe.  Lab work is satisfactory.  He denies any side effects

## 2012-12-16 NOTE — Assessment & Plan Note (Signed)
The patient has not had any symptoms of recurrent angina pectoris

## 2013-02-22 ENCOUNTER — Other Ambulatory Visit: Payer: Self-pay | Admitting: *Deleted

## 2013-02-22 ENCOUNTER — Other Ambulatory Visit: Payer: Self-pay

## 2013-02-22 MED ORDER — FENOFIBRATE 160 MG PO TABS: 160.0000 mg | ORAL_TABLET | Freq: Every day | ORAL | Status: DC

## 2013-02-22 MED ORDER — EZETIMIBE 10 MG PO TABS: ORAL_TABLET | ORAL | Status: DC

## 2013-04-27 ENCOUNTER — Other Ambulatory Visit (HOSPITAL_BASED_OUTPATIENT_CLINIC_OR_DEPARTMENT_OTHER): Payer: Medicare Other | Admitting: Lab

## 2013-04-27 ENCOUNTER — Ambulatory Visit (HOSPITAL_BASED_OUTPATIENT_CLINIC_OR_DEPARTMENT_OTHER): Payer: Medicare Other | Admitting: Hematology & Oncology

## 2013-04-27 VITALS — BP 150/58 | HR 53 | Temp 97.4°F | Resp 18 | Ht 70.0 in | Wt 184.0 lb

## 2013-04-27 DIAGNOSIS — D509 Iron deficiency anemia, unspecified: Secondary | ICD-10-CM

## 2013-04-27 LAB — IRON AND TIBC CHCC
%SAT: 18 % — ABNORMAL LOW (ref 20–55)
TIBC: 330 ug/dL (ref 202–409)
UIBC: 271 ug/dL (ref 117–376)

## 2013-04-27 LAB — CBC WITH DIFFERENTIAL (CANCER CENTER ONLY)
BASO%: 0.4 % (ref 0.0–2.0)
HCT: 37.7 % — ABNORMAL LOW (ref 38.7–49.9)
LYMPH%: 25.1 % (ref 14.0–48.0)
MCH: 29 pg (ref 28.0–33.4)
MCV: 90 fL (ref 82–98)
MONO%: 8.1 % (ref 0.0–13.0)
NEUT%: 63.6 % (ref 40.0–80.0)
Platelets: 132 10*3/uL — ABNORMAL LOW (ref 145–400)
RDW: 13.2 % (ref 11.1–15.7)

## 2013-04-27 LAB — FERRITIN CHCC: Ferritin: 102 ng/ml (ref 22–316)

## 2013-04-27 LAB — CHCC SATELLITE - SMEAR

## 2013-04-27 LAB — RETICULOCYTES (CHCC): ABS Retic: 63.3 10*3/uL (ref 19.0–186.0)

## 2013-04-27 NOTE — Progress Notes (Signed)
This office note has been dictated.

## 2013-04-28 NOTE — Progress Notes (Signed)
CC:   Luis Rowe, M.D.  DIAGNOSIS:  Pancytopenia.  CURRENT THERAPY:  Observation.  INTERIM HISTORY:  Luis Rowe comes in for followup.  We last saw him back in January.  Since then, he has been doing okay.  He has had no problems.  He has had no bleeding or bruising.  There has been no infection issues.  His appetite is doing good.  He has had no cough or shortness breath.  He has had no leg swelling.  There has been no rashes.  When we last saw him, his iron studies showed a ferritin of 129 with an iron saturation of 24%.  He has had no cardiac issues.  He has had no abdominal issues.  There has been no change in his medications.  PHYSICAL EXAMINATION:  General:  This is an elderly, but well-nourished white gentleman in no obvious distress.  Vital Signs:  Showed temperature 97.4.  Pulse 53.  Respiratory rate 18.  Blood pressure 150/58.  Weight is 184.  Head and Neck:  Exam shows a normocephalic, atraumatic skull.  There are no ocular or oral lesions.  There is no scleral icterus.  There is no adenopathy in the neck.  Lungs:  Clear bilaterally.  Cardiac:  Regular rate and rhythm with a normal S1 and S2. There are no murmurs, rubs, or bruits.  Abdomen:  Soft.  He has good bowel sounds.  There is no fluid wave.  There is no palpable hepatosplenomegaly.  Extremities:  Show no clubbing, cyanosis, or edema. He has good range of motion of his joints.  He has some osteoarthritic changes in his joints.  Skin:  Exam shows no rashes, ecchymoses, or petechia.  LABORATORY STUDIES:  White cell count is 2.5, hemoglobin 12.2, hematocrit 37.7, platelet count 132.  MCV is 90.  White cell differential shows 64 segs, 25 lymphocytes, 8 monos.  Peripheral smear shows good maturation of his white blood cells.  I see no immature myeloid cells.  There is no hypersegmented polys.  There is no blasts.  He has no nucleated red blood cells.  There is no rouleaux formation.  I see no target cells.   Platelets are adequate in number and size.  Platelets are well granulated.  IMPRESSION:  Luis Rowe is a very nice 79 year old white gentleman with basically pancytopenia.  One would suspect that he may have underlying myelodysplasia, but again, he is totally asymptomatic with this.  I still feel we have to follow him along every 6 months.  I want to go ahead and get him back in 6 months' time.  I do not see a need for any blood work in between visits.    ______________________________ Josph Macho, M.D. PRE/MEDQ  D:  04/27/2013  T:  04/28/2013  Job:  [id number]

## 2013-05-13 ENCOUNTER — Other Ambulatory Visit: Payer: Self-pay | Admitting: *Deleted

## 2013-05-13 DIAGNOSIS — R52 Pain, unspecified: Secondary | ICD-10-CM

## 2013-05-13 MED ORDER — TRAMADOL HCL 50 MG PO TABS: 50.0000 mg | ORAL_TABLET | Freq: Every day | ORAL | Status: DC

## 2013-05-16 ENCOUNTER — Other Ambulatory Visit: Payer: Self-pay | Admitting: *Deleted

## 2013-05-16 NOTE — Telephone Encounter (Signed)
Patient called this morning regarding 7 day supply of Tramadol 50 mg tablet to be sent to [facility] [address] sinc he almost out and waiting on mail order refill. I called pharmacy and sent this rx refill verbal. Thanks Monserat Prestigiacomo 

## 2013-05-16 NOTE — Telephone Encounter (Signed)
Patient called this morning regarding 7 day supply of Tramadol 50 mg tablet to be sent to [facility] [address] sinc he almost out and waiting on mail order refill. I called pharmacy and sent this rx refill verbal. Thanks Turkey

## 2013-06-15 ENCOUNTER — Other Ambulatory Visit (INDEPENDENT_AMBULATORY_CARE_PROVIDER_SITE_OTHER): Payer: Medicare Other

## 2013-06-15 DIAGNOSIS — E7800 Pure hypercholesterolemia, unspecified: Secondary | ICD-10-CM

## 2013-06-15 DIAGNOSIS — I119 Hypertensive heart disease without heart failure: Secondary | ICD-10-CM

## 2013-06-16 LAB — HEPATIC FUNCTION PANEL
AST: 28 U/L (ref 0–37)
Alkaline Phosphatase: 50 U/L (ref 39–117)
Bilirubin, Direct: 0.1 mg/dL (ref 0.0–0.3)
Total Bilirubin: 0.5 mg/dL (ref 0.3–1.2)

## 2013-06-16 LAB — LIPID PANEL
LDL Cholesterol: 102 mg/dL — ABNORMAL HIGH (ref 0–99)
Total CHOL/HDL Ratio: 5
Triglycerides: 57 mg/dL (ref 0.0–149.0)

## 2013-06-16 LAB — BASIC METABOLIC PANEL
CO2: 31 mEq/L (ref 19–32)
Chloride: 104 mEq/L (ref 96–112)
Sodium: 138 mEq/L (ref 135–145)

## 2013-06-17 NOTE — Progress Notes (Signed)
Quick Note:  Please make copy of labs for patient visit. ______ 

## 2013-06-22 ENCOUNTER — Ambulatory Visit (INDEPENDENT_AMBULATORY_CARE_PROVIDER_SITE_OTHER): Payer: Medicare Other | Admitting: Cardiology

## 2013-06-22 ENCOUNTER — Encounter: Payer: Self-pay | Admitting: Cardiology

## 2013-06-22 VITALS — BP 118/60 | HR 53 | Ht 70.0 in | Wt 181.8 lb

## 2013-06-22 DIAGNOSIS — I119 Hypertensive heart disease without heart failure: Secondary | ICD-10-CM

## 2013-06-22 DIAGNOSIS — E7800 Pure hypercholesterolemia, unspecified: Secondary | ICD-10-CM

## 2013-06-22 DIAGNOSIS — I259 Chronic ischemic heart disease, unspecified: Secondary | ICD-10-CM

## 2013-06-22 DIAGNOSIS — D509 Iron deficiency anemia, unspecified: Secondary | ICD-10-CM

## 2013-06-22 NOTE — Assessment & Plan Note (Signed)
The patient has not had to have any infusion of intravenous iron for more than a year

## 2013-06-22 NOTE — Assessment & Plan Note (Signed)
Lipids remain stable on current therapy.  The patient is intolerant of statins.. the patient is on ezetimibe with no side effects or complications

## 2013-06-22 NOTE — Patient Instructions (Signed)
Your physician recommends that you continue on your current medications as directed. Please refer to the Current Medication list given to you today.  Your physician wants you to follow-up in: 6 months with fasting labs (lp/bmet/hfp) AND EKG  You will receive a reminder letter in the mail two months in advance. If you don't receive a letter, please call our office to schedule the follow-up appointment.  

## 2013-06-22 NOTE — Assessment & Plan Note (Signed)
Blood pressure was remaining stable on current therapy.  No dizziness or syncope.  No headache

## 2013-06-22 NOTE — Progress Notes (Signed)
Allegra Lai Date of Birth:  Nov 02, 1925 [facility] [address] [place], [place]  [address] [phone]         Fax   [phone]  History of Present Illness: This pleasant 79 year old gentleman is seen for a scheduled 6 month followup office visit. He has a history of known ischemic heart disease. He had stents in 2001 and again in 2003. His last nuclear stress test on 03/25/10 showed no ischemia and his ejection fraction was 71%. The patient has a history of hypercholesterolemia and a history of BPH. He also has a history of osteoarthritis and underwent right total knee replacement in September 2001.  He has a history of chronic anemia and is followed by Dr.Ennever.  Since last visit he has been doing well with no new symptoms.    Current Outpatient Prescriptions  Medication Sig Dispense Refill  . aspirin 325 MG EC tablet Take 325 mg by mouth daily.        . Calcium Carbonate-Vitamin D (CALCIUM-VITAMIN D) 500-200 MG-UNIT per tablet Take 2 tablets by mouth every morning.       . Doxylamine Succinate, Sleep, (SLEEP AID PO) Take by mouth. 1/2 daily       . ezetimibe (ZETIA) 10 MG tablet TAKE 1 TABLET DAILY  90 tablet  2  . fenofibrate 160 MG tablet Take 1 tablet (160 mg total) by mouth daily.  90 tablet  2  . Magnesium 400 MG CAPS Take by mouth every morning.      . metoprolol succinate (TOPROL XL) 50 MG 24 hr tablet Take 1 tablet (50 mg total) by mouth daily.  90 tablet  3  . Multiple Vitamin (MULTIVITAMIN) tablet Take 1 tablet by mouth daily.        . Multiple Vitamins-Minerals (PRESERVISION AREDS 2 PO) Take by mouth 2 (two) times daily.      . Naproxen Sodium (ALEVE) 220 MG CAPS Take by mouth.        . Omega-3 Fatty Acids (FISH OIL) 1000 MG CAPS Take 1,000 mg by mouth 2 (two) times daily. Taking 1200 daily      . omeprazole (PRILOSEC) 20 MG capsule Take 20 mg by mouth daily.        . polyethylene glycol (MIRALAX / GLYCOLAX) packet Take 17 g by mouth daily.         . Selenium 200 MCG CAPS Take by mouth daily.        . Tamsulosin HCl (FLOMAX) 0.4 MG CAPS Take by mouth 2 (two) times daily. Dr. Vonita Moss      . traMADol (ULTRAM) 50 MG tablet Take 1 tablet (50 mg total) by mouth daily.  90 tablet  3   No current facility-administered medications for this visit.    Allergies  Allergen Reactions  . Crestor [Rosuvastatin Calcium]     myalgias  . Lipitor [Atorvastatin Calcium]     myalgias  . Zocor [Simvastatin - High Dose]     myalgias    Patient Active Problem List   Diagnosis Date Noted  . Anemia, iron deficiency 05/17/2012  . Leukopenia 05/10/2012  . Arthritis   . Benign hypertensive heart disease without heart failure 05/07/2011  . Hypercholesterolemia 05/07/2011  . Ischemic heart disease 05/07/2011  . BPH (benign prostatic hyperplasia) 05/07/2011    History  Smoking status  . Former Smoker  . Types: Cigarettes  Smokeless tobacco  . Not on file    History  Alcohol Use: Not on file  No family history on file.  Review of Systems: Constitutional: no fever chills diaphoresis or fatigue or change in weight.  Head and neck: no hearing loss, no epistaxis, no photophobia or visual disturbance. Respiratory: No cough, shortness of breath or wheezing. Cardiovascular: No chest pain peripheral edema, palpitations. Gastrointestinal: No abdominal distention, no abdominal pain, no change in bowel habits hematochezia or melena. Genitourinary: No dysuria, no frequency, no urgency, no nocturia. Musculoskeletal:No arthralgias, no back pain, no gait disturbance or myalgias. Neurological: No dizziness, no headaches, no numbness, no seizures, no syncope, no weakness, no tremors. Hematologic: No lymphadenopathy, no easy bruising. Psychiatric: No confusion, no hallucinations, no sleep disturbance.    Physical Exam: Filed Vitals:   06/22/13 0954  BP: 118/60  Pulse: 53   the general appearance reveals a tall elderly gentleman in no distress.   His weight is down 5 pounds since last visit.The head and neck exam reveals pupils equal and reactive.  Extraocular movements are full.  There is no scleral icterus.  The mouth and pharynx are normal.  The neck is supple.  The carotids reveal no bruits.  The jugular venous pressure is normal.  The  thyroid is not enlarged.  There is no lymphadenopathy.  The chest is clear to percussion and auscultation.  There are no rales or rhonchi.  Expansion of the chest is symmetrical.  The precordium is quiet.  The first heart sound is normal.  The second heart sound is physiologically split.  There is no murmur gallop rub or click.  There is no abnormal lift or heave.  The abdomen is soft and nontender.  The bowel sounds are normal.  The liver and spleen are not enlarged.  There are no abdominal masses.  There are no abdominal bruits.  Extremities reveal good pedal pulses.  There is no phlebitis or edema.  There is no cyanosis or clubbing.  Strength is normal and symmetrical in all extremities.  There is no lateralizing weakness.  There are no sensory deficits.  The skin is warm and dry.  There is no rash.     Assessment / Plan:  Continue same medication.  Recheck in 6 months for office visit EKG lipid panel hepatic function panel and basal metabolic panel

## 2013-06-22 NOTE — Assessment & Plan Note (Signed)
No recurrent chest pain or angina.  He still is active doing his own yard work at home

## 2013-06-27 ENCOUNTER — Other Ambulatory Visit: Payer: Medicare Other | Admitting: Lab

## 2013-06-27 ENCOUNTER — Ambulatory Visit: Payer: Medicare Other | Admitting: Hematology & Oncology

## 2013-10-26 ENCOUNTER — Other Ambulatory Visit (HOSPITAL_BASED_OUTPATIENT_CLINIC_OR_DEPARTMENT_OTHER): Payer: Medicare Other | Admitting: Lab

## 2013-10-26 ENCOUNTER — Ambulatory Visit (HOSPITAL_BASED_OUTPATIENT_CLINIC_OR_DEPARTMENT_OTHER): Payer: Medicare Other | Admitting: Hematology & Oncology

## 2013-10-26 VITALS — BP 121/54 | HR 57 | Temp 97.3°F | Resp 18 | Ht 70.0 in | Wt 184.0 lb

## 2013-10-26 DIAGNOSIS — D509 Iron deficiency anemia, unspecified: Secondary | ICD-10-CM

## 2013-10-26 DIAGNOSIS — D61818 Other pancytopenia: Secondary | ICD-10-CM

## 2013-10-26 DIAGNOSIS — D649 Anemia, unspecified: Secondary | ICD-10-CM

## 2013-10-26 LAB — CBC WITH DIFFERENTIAL (CANCER CENTER ONLY)
BASO#: 0 10*3/uL (ref 0.0–0.2)
BASO%: 0.4 % (ref 0.0–2.0)
EOS%: 3.4 % (ref 0.0–7.0)
Eosinophils Absolute: 0.1 10*3/uL (ref 0.0–0.5)
HEMATOCRIT: 36.4 % — AB (ref 38.7–49.9)
HEMOGLOBIN: 11.7 g/dL — AB (ref 13.0–17.1)
LYMPH#: 0.7 10*3/uL — AB (ref 0.9–3.3)
LYMPH%: 31.5 % (ref 14.0–48.0)
MCH: 28.1 pg (ref 28.0–33.4)
MCHC: 32.1 g/dL (ref 32.0–35.9)
MCV: 88 fL (ref 82–98)
MONO#: 0.1 10*3/uL (ref 0.1–0.9)
MONO%: 5.5 % (ref 0.0–13.0)
NEUT#: 1.4 10*3/uL — ABNORMAL LOW (ref 1.5–6.5)
NEUT%: 59.2 % (ref 40.0–80.0)
Platelets: 135 10*3/uL — ABNORMAL LOW (ref 145–400)
RBC: 4.16 10*6/uL — ABNORMAL LOW (ref 4.20–5.70)
RDW: 13.4 % (ref 11.1–15.7)
WBC: 2.4 10*3/uL — ABNORMAL LOW (ref 4.0–10.0)

## 2013-10-26 LAB — IRON AND TIBC CHCC
%SAT: 23 % (ref 20–55)
IRON: 86 ug/dL (ref 42–163)
TIBC: 370 ug/dL (ref 202–409)
UIBC: 284 ug/dL (ref 117–376)

## 2013-10-26 LAB — RETICULOCYTES (CHCC)
ABS Retic: 68.5 10*3/uL (ref 19.0–186.0)
RBC.: 4.28 MIL/uL (ref 4.22–5.81)
Retic Ct Pct: 1.6 % (ref 0.4–2.3)

## 2013-10-26 LAB — FERRITIN CHCC: Ferritin: 68 ng/ml (ref 22–316)

## 2013-10-26 LAB — CHCC SATELLITE - SMEAR

## 2013-10-26 NOTE — Progress Notes (Signed)
This office note has been dictated.

## 2013-10-28 NOTE — Progress Notes (Signed)
CC:   Cassell Clementhomas Brackbill, M.D.  DIAGNOSIS:  Pancytopenia-likely myelodysplasia.  CURRENT THERAPY:  Observation.  INTERIM HISTORY:  Mr. Debroah Looprnold comes in for a followup.  We are seeing every 6 months.  He is feeling pretty well.  Unfortunately, his sister- in-law passed away and he had to go to [place] on Friday to the wedding.  Otherwise, he has been doing well.  He has had no fatigue or weakness himself.  He enjoyed the holidays.  He ate well.  He has not noted any problems with bleeding.  He has had no weight loss.  He has had no bruising.  There has been no change in bowel or bladder habits.  PHYSICAL EXAMINATION:  General:  On his physical exam, this is an elderly, but well-nourished white gentleman in no obvious distress. Vital Signs:  Temperature of 97.3, pulse 57, respiratory rate 18, blood pressure 121/54.  Weight is 184 pounds.  Head and Neck:  Normocephalic, atraumatic skull.  There are no ocular or oral lesions.  He has no palpable cervical or supraclavicular lymph nodes.  Lungs:  Clear bilaterally.  Cardiac:  Regular rate and rhythm with a normal S1, S2. There are no murmurs, rubs, or bruits.  Abdomen:  Soft.  He has good bowel sounds.  There is no fluid wave.  There is no palpable abdominal mass.  There is no palpable hepatosplenomegaly.  Back:  No tenderness over the spine, ribs, or hips.  Extremities:  No clubbing, cyanosis, or edema.  He has good range motion of the joints.  Skin:  No ecchymoses. Neurological:  No focal neurological deficits.  LABORATORY STUDIES:  White cell count is 2.4, hemoglobin 11.7, hematocrit 36.4, platelet count 135.  MCV is 88.  White cell differential shows 59 segs, 31 lymphs, 6 monos, 3 eosinophils.  On his peripheral smear, he has good maturation of his white blood cells.  He may have a few atypical lymphocytes.  There may be a couple of myelocytes.  I do not see any blasts.  He has no hypersegmented polys.  There is no nucleated red  blood cells and has no rouleaux formation.  Platelets are adequate in number and size.  IMPRESSION:  Mr. Debroah Looprnold is a very nice 80 year old gentleman with pancytopenia.  We have been following him now for a year or more.  His blood counts have been trending slowly down but yet he is asymptomatic right now.  We will probably plan to get him back in 6 months' time.  I do not see that we need any blood work in between visits.   ______________________________ Josph MachoPeter R Daelen Belvedere, M.D. PRE/MEDQ  D:  10/26/2013  T:  10/27/2013  Job:  [id number]

## 2013-12-22 ENCOUNTER — Other Ambulatory Visit (INDEPENDENT_AMBULATORY_CARE_PROVIDER_SITE_OTHER): Payer: Medicare Other

## 2013-12-22 DIAGNOSIS — I119 Hypertensive heart disease without heart failure: Secondary | ICD-10-CM

## 2013-12-22 DIAGNOSIS — E7800 Pure hypercholesterolemia, unspecified: Secondary | ICD-10-CM

## 2013-12-22 LAB — BASIC METABOLIC PANEL
BUN: 34 mg/dL — ABNORMAL HIGH (ref 6–23)
CO2: 29 mEq/L (ref 19–32)
Calcium: 9.1 mg/dL (ref 8.4–10.5)
Chloride: 104 mEq/L (ref 96–112)
Creatinine, Ser: 1.6 mg/dL — ABNORMAL HIGH (ref 0.4–1.5)
GFR: 44.43 mL/min — ABNORMAL LOW (ref >60.00)
Glucose, Bld: 75 mg/dL (ref 70–99)
Potassium: 4.7 mEq/L (ref 3.5–5.1)
Sodium: 138 mEq/L (ref 135–145)

## 2013-12-22 LAB — LIPID PANEL
Cholesterol: 130 mg/dL (ref 0–200)
HDL: 27.9 mg/dL — ABNORMAL LOW (ref >39.00)
LDL Cholesterol: 91 mg/dL (ref 0–99)
Total CHOL/HDL Ratio: 5
Triglycerides: 57 mg/dL (ref 0.0–149.0)
VLDL: 11.4 mg/dL (ref 0.0–40.0)

## 2013-12-22 LAB — HEPATIC FUNCTION PANEL
ALT: 20 U/L (ref 0–53)
AST: 29 U/L (ref 0–37)
Albumin: 3.7 g/dL (ref 3.5–5.2)
Alkaline Phosphatase: 46 U/L (ref 39–117)
Bilirubin, Direct: 0.2 mg/dL (ref 0.0–0.3)
Total Bilirubin: 0.7 mg/dL (ref 0.3–1.2)
Total Protein: 6.7 g/dL (ref 6.0–8.3)

## 2013-12-22 NOTE — Progress Notes (Signed)
Quick Note:  Please make copy of labs for patient visit. ______ 

## 2013-12-24 ENCOUNTER — Other Ambulatory Visit: Payer: Self-pay | Admitting: Cardiology

## 2013-12-26 ENCOUNTER — Other Ambulatory Visit: Payer: Self-pay | Admitting: *Deleted

## 2013-12-26 ENCOUNTER — Ambulatory Visit (INDEPENDENT_AMBULATORY_CARE_PROVIDER_SITE_OTHER): Payer: Medicare Other | Admitting: Cardiology

## 2013-12-26 ENCOUNTER — Encounter: Payer: Self-pay | Admitting: Cardiology

## 2013-12-26 VITALS — BP 160/68 | HR 57 | Ht 70.0 in | Wt 183.0 lb

## 2013-12-26 DIAGNOSIS — I119 Hypertensive heart disease without heart failure: Secondary | ICD-10-CM

## 2013-12-26 DIAGNOSIS — R52 Pain, unspecified: Secondary | ICD-10-CM

## 2013-12-26 DIAGNOSIS — D509 Iron deficiency anemia, unspecified: Secondary | ICD-10-CM

## 2013-12-26 DIAGNOSIS — E7800 Pure hypercholesterolemia, unspecified: Secondary | ICD-10-CM

## 2013-12-26 DIAGNOSIS — I259 Chronic ischemic heart disease, unspecified: Secondary | ICD-10-CM

## 2013-12-26 DIAGNOSIS — D72819 Decreased white blood cell count, unspecified: Secondary | ICD-10-CM

## 2013-12-26 MED ORDER — TRAMADOL HCL 50 MG PO TABS: 50.0000 mg | ORAL_TABLET | Freq: Every day | ORAL | Status: DC

## 2013-12-26 NOTE — Assessment & Plan Note (Signed)
Blood pressure is remaining stable on current therapy.  No symptoms of CHF.  No dizziness or syncope.  No palpitations.

## 2013-12-26 NOTE — Assessment & Plan Note (Signed)
The patient remains on ezetimibe and fenofibrate.  His lipids remain satisfactory.  He is intolerant of statins.  He is not having any myalgias.  Liver function studies remain normal.

## 2013-12-26 NOTE — Patient Instructions (Signed)
Your physician recommends that you continue on your current medications as directed. Please refer to the Current Medication list given to you today.  Your physician wants you to follow-up in: 6 month ov You will receive a reminder letter in the mail two months in advance. If you don't receive a letter, please call our office to schedule the follow-up appointment.  

## 2013-12-26 NOTE — Progress Notes (Signed)
Luis Rowe Date of Birth:  May 19, 1926 [address] [place], [place]  [address] [phone]         Fax   [phone]  History of Present Illness: This pleasant 80 year old gentleman is seen for a scheduled 6 month followup office visit. He has a history of known ischemic heart disease. He had stents in 2001 and again in 2003. His last nuclear stress test on 03/25/10 showed no ischemia and his ejection fraction was 71%. The patient has a history of hypercholesterolemia and a history of BPH. He also has a history of osteoarthritis and underwent right total knee replacement in September 2001.  He has a history of chronic anemia and is followed by Dr.Ennever.  Since last visit he has been doing well with no new symptoms.  Last week he slipped on the ice and took a bad fall and suffered an abrasion of his head but no serious internal injury.   Current Outpatient Prescriptions  Medication Sig Dispense Refill  . aspirin 325 MG EC tablet Take 325 mg by mouth daily.        . Calcium Carbonate-Vitamin D (CALCIUM-VITAMIN D) 500-200 MG-UNIT per tablet Take 2 tablets by mouth every morning.       . Doxylamine Succinate, Sleep, (SLEEP AID PO) Take by mouth. 1/2 daily       . ezetimibe (ZETIA) 10 MG tablet TAKE 1 TABLET DAILY  90 tablet  2  . fenofibrate 160 MG tablet TAKE 1 TABLET DAILY  90 tablet  1  . Magnesium 400 MG CAPS Take by mouth every morning.      . metoprolol succinate (TOPROL XL) 50 MG 24 hr tablet Take 1 tablet (50 mg total) by mouth daily.  90 tablet  3  . Multiple Vitamin (MULTIVITAMIN) tablet Take 1 tablet by mouth daily.        . Multiple Vitamins-Minerals (PRESERVISION AREDS 2 PO) Take by mouth 2 (two) times daily.      . Naproxen Sodium (ALEVE) 220 MG CAPS Take by mouth.        . Omega-3 Fatty Acids (FISH OIL) 1000 MG CAPS Take 1,000 mg by mouth 2 (two) times daily. Taking 1200 daily      . omeprazole (PRILOSEC) 20 MG capsule Take 20 mg by mouth daily.        .  polyethylene glycol (MIRALAX / GLYCOLAX) packet Take 17 g by mouth daily.       . Selenium 200 MCG CAPS Take by mouth daily.        . Tamsulosin HCl (FLOMAX) 0.4 MG CAPS Take by mouth 2 (two) times daily. Dr. Vonita Moss      . traMADol (ULTRAM) 50 MG tablet Take 1 tablet (50 mg total) by mouth daily.  90 tablet  3   No current facility-administered medications for this visit.    Allergies  Allergen Reactions  . Crestor [Rosuvastatin Calcium]     myalgias  . Lipitor [Atorvastatin Calcium]     myalgias  . Zocor [Simvastatin - High Dose]     myalgias    Patient Active Problem List   Diagnosis Date Noted  . Anemia, iron deficiency 05/17/2012  . Leukopenia 05/10/2012  . Arthritis   . Benign hypertensive heart disease without heart failure 05/07/2011  . Hypercholesterolemia 05/07/2011  . Ischemic heart disease 05/07/2011  . BPH (benign prostatic hyperplasia) 05/07/2011    History  Smoking status  . Former Smoker  . Types: Cigarettes  Smokeless  tobacco  . Not on file    History  Alcohol Use: Not on file    No family history on file.  Review of Systems: Constitutional: no fever chills diaphoresis or fatigue or change in weight.  Head and neck: no hearing loss, no epistaxis, no photophobia or visual disturbance. Respiratory: No cough, shortness of breath or wheezing. Cardiovascular: No chest pain peripheral edema, palpitations. Gastrointestinal: No abdominal distention, no abdominal pain, no change in bowel habits hematochezia or melena. Genitourinary: No dysuria, no frequency, no urgency, no nocturia. Musculoskeletal:No arthralgias, no back pain, no gait disturbance or myalgias. Neurological: No dizziness, no headaches, no numbness, no seizures, no syncope, no weakness, no tremors. Hematologic: No lymphadenopathy, no easy bruising. Psychiatric: No confusion, no hallucinations, no sleep disturbance.    Physical Exam: Filed Vitals:   12/26/13 0925  BP: 160/68  Pulse:  57   the general appearance reveals a tall elderly gentleman in no distress.  His weight is down 5 pounds since last visit.The head and neck exam reveals pupils equal and reactive.  Extraocular movements are full.  There is no scleral icterus.  The mouth and pharynx are normal.  The neck is supple.  The carotids reveal no bruits.  The jugular venous pressure is normal.  The  thyroid is not enlarged.  There is no lymphadenopathy.  The chest is clear to percussion and auscultation.  There are no rales or rhonchi.  Expansion of the chest is symmetrical.  The precordium is quiet.  The first heart sound is normal.  The second heart sound is physiologically split.  There is no murmur gallop rub or click.  There is no abnormal lift or heave.  The abdomen is soft and nontender.  The bowel sounds are normal.  The liver and spleen are not enlarged.  There are no abdominal masses.  There are no abdominal bruits.  Extremities reveal good pedal pulses.  There is no phlebitis or edema.  There is no cyanosis or clubbing.  Strength is normal and symmetrical in all extremities.  There is no lateralizing weakness.  There are no sensory deficits.  The skin is warm and dry.  There is no rash.     Assessment / Plan:  Continue same medication.  Recheck in 6 months for office visit EKG lipid panel hepatic function panel and basal metabolic panel   Luis Rowe Date of Birth:  [birth year] [facility] [address] [place], [place]  [address] [phone]         Fax   [phone]  History of Present Illness: This pleasant 80 year old gentleman is seen for a scheduled 6 month followup office visit. He has a history of known ischemic heart disease. He had stents in 2001 and again in 2003. His last nuclear stress test on 03/25/10 showed no ischemia and his ejection fraction was 71%. The patient has a history of hypercholesterolemia and a history of BPH. He also has a history of osteoarthritis and  underwent right total knee replacement in September 2001.  He has a history of chronic anemia and is followed by Dr.Ennever.  Since last visit he has been doing well with no new symptoms.    Current Outpatient Prescriptions  Medication Sig Dispense Refill  . aspirin 325 MG EC tablet Take 325 mg by mouth daily.        . Calcium Carbonate-Vitamin D (CALCIUM-VITAMIN D) 500-200 MG-UNIT per tablet Take 2 tablets by mouth every morning.       Marland Kitchen  Doxylamine Succinate, Sleep, (SLEEP AID PO) Take by mouth. 1/2 daily       . ezetimibe (ZETIA) 10 MG tablet TAKE 1 TABLET DAILY  90 tablet  2  . fenofibrate 160 MG tablet TAKE 1 TABLET DAILY  90 tablet  1  . Magnesium 400 MG CAPS Take by mouth every morning.      . metoprolol succinate (TOPROL XL) 50 MG 24 hr tablet Take 1 tablet (50 mg total) by mouth daily.  90 tablet  3  . Multiple Vitamin (MULTIVITAMIN) tablet Take 1 tablet by mouth daily.        . Multiple Vitamins-Minerals (PRESERVISION AREDS 2 PO) Take by mouth 2 (two) times daily.      . Naproxen Sodium (ALEVE) 220 MG CAPS Take by mouth.        . Omega-3 Fatty Acids (FISH OIL) 1000 MG CAPS Take 1,000 mg by mouth 2 (two) times daily. Taking 1200 daily      . omeprazole (PRILOSEC) 20 MG capsule Take 20 mg by mouth daily.        . polyethylene glycol (MIRALAX / GLYCOLAX) packet Take 17 g by mouth daily.       . Selenium 200 MCG CAPS Take by mouth daily.        . Tamsulosin HCl (FLOMAX) 0.4 MG CAPS Take by mouth 2 (two) times daily. Dr. Vonita MossPeterson      . traMADol (ULTRAM) 50 MG tablet Take 1 tablet (50 mg total) by mouth daily.  90 tablet  3   No current facility-administered medications for this visit.    Allergies  Allergen Reactions  . Crestor [Rosuvastatin Calcium]     myalgias  . Lipitor [Atorvastatin Calcium]     myalgias  . Zocor [Simvastatin - High Dose]     myalgias    Patient Active Problem List   Diagnosis Date Noted  . Anemia, iron deficiency 05/17/2012  . Leukopenia 05/10/2012    . Arthritis   . Benign hypertensive heart disease without heart failure 05/07/2011  . Hypercholesterolemia 05/07/2011  . Ischemic heart disease 05/07/2011  . BPH (benign prostatic hyperplasia) 05/07/2011    History  Smoking status  . Former Smoker  . Types: Cigarettes  Smokeless tobacco  . Not on file    History  Alcohol Use: Not on file    No family history on file.  Review of Systems: Constitutional: no fever chills diaphoresis or fatigue or change in weight.  Head and neck: no hearing loss, no epistaxis, no photophobia or visual disturbance. Respiratory: No cough, shortness of breath or wheezing. Cardiovascular: No chest pain peripheral edema, palpitations. Gastrointestinal: No abdominal distention, no abdominal pain, no change in bowel habits hematochezia or melena. Genitourinary: No dysuria, no frequency, no urgency, no nocturia. Musculoskeletal:No arthralgias, no back pain, no gait disturbance or myalgias. Neurological: No dizziness, no headaches, no numbness, no seizures, no syncope, no weakness, no tremors. Hematologic: No lymphadenopathy, no easy bruising. Psychiatric: No confusion, no hallucinations, no sleep disturbance.    Physical Exam: Filed Vitals:   12/26/13 0925  BP: 160/68  Pulse: 57   the general appearance reveals a tall elderly gentleman in no distress.  His weight is down 5 pounds since last visit.The head and neck exam reveals pupils equal and reactive.  Extraocular movements are full.  There is no scleral icterus.  The mouth and pharynx are normal.  The neck is supple.  The carotids reveal no bruits.  The jugular venous pressure is normal.  The  thyroid is not enlarged.  There is no lymphadenopathy.  The chest is clear to percussion and auscultation.  There are no rales or rhonchi.  Expansion of the chest is symmetrical.  The precordium is quiet.  The first heart sound is normal.  The second heart sound is physiologically split.  There is no murmur  gallop rub or click.  There is no abnormal lift or heave.  The abdomen is soft and nontender.  The bowel sounds are normal.  The liver and spleen are not enlarged.  There are no abdominal masses.  There are no abdominal bruits.  Extremities reveal good pedal pulses.  There is no phlebitis or edema.  There is no cyanosis or clubbing.  Strength is normal and symmetrical in all extremities.  There is no lateralizing weakness.  There are no sensory deficits.  The skin is warm and dry.  There is no rash.  EKG shows sinus bradycardia with first degree AV block and is unchanged since 05/10/12  Assessment / Plan:  Continue same medication.  Recheck in 6 months for office visit  lipid panel hepatic function panel and basal metabolic panel. He is in the process of starting to look at retirement centers.  His family is encouraging him to do that.

## 2013-12-26 NOTE — Assessment & Plan Note (Signed)
The patient has not been experiencing any recurrent chest pain or angina. 

## 2013-12-28 ENCOUNTER — Other Ambulatory Visit: Payer: Self-pay

## 2013-12-28 MED ORDER — METOPROLOL SUCCINATE ER 50 MG PO TB24: 50.0000 mg | ORAL_TABLET | Freq: Every day | ORAL | Status: DC

## 2014-01-02 ENCOUNTER — Other Ambulatory Visit: Payer: Self-pay | Admitting: Cardiology

## 2014-03-29 ENCOUNTER — Telehealth: Payer: Self-pay | Admitting: Hematology & Oncology

## 2014-03-29 NOTE — Telephone Encounter (Signed)
Pt called today to cancel due to moving.

## 2014-04-03 ENCOUNTER — Telehealth: Payer: Self-pay | Admitting: Cardiology

## 2014-04-03 DIAGNOSIS — I119 Hypertensive heart disease without heart failure: Secondary | ICD-10-CM

## 2014-04-03 DIAGNOSIS — E7800 Pure hypercholesterolemia, unspecified: Secondary | ICD-10-CM

## 2014-04-03 NOTE — Telephone Encounter (Signed)
Pt would like his fasting cholesterol lab work drawn same day as September appointment with Dr. Patty SermonsBrackbill. Pt is aware his appointment with Dr. Patty SermonsBrackbill is not until 3pm  Rescheduled pt to 06/20/14 for 6 month appt with Dr. Patty SermonsBrackbill.  He will come in fasting for cholesterol labs after. I have not placed those labs Mylo Redebbie Aiyanna Awtrey RN

## 2014-04-03 NOTE — Telephone Encounter (Signed)
New message     Pt want to come in that am to have fasting labs.  Is this ok?  Please call him

## 2014-04-03 NOTE — Telephone Encounter (Signed)
lmtcb Debbie Dontavia Brand RN  

## 2014-04-18 NOTE — Telephone Encounter (Signed)
Orders in Epic.

## 2014-04-18 NOTE — Addendum Note (Signed)
Addended by: Regis BillPRATT, MELINDA B on: 04/18/2014 11:35 AM   Modules accepted: Orders

## 2014-04-26 ENCOUNTER — Other Ambulatory Visit: Payer: Medicare Other | Admitting: Lab

## 2014-04-26 ENCOUNTER — Ambulatory Visit: Payer: Medicare Other | Admitting: Hematology & Oncology

## 2014-06-01 ENCOUNTER — Other Ambulatory Visit: Payer: Self-pay | Admitting: Cardiology

## 2014-06-20 ENCOUNTER — Encounter: Payer: Self-pay | Admitting: Cardiology

## 2014-06-20 ENCOUNTER — Ambulatory Visit (INDEPENDENT_AMBULATORY_CARE_PROVIDER_SITE_OTHER): Payer: Medicare Other | Admitting: Cardiology

## 2014-06-20 VITALS — BP 140/80 | HR 54 | Ht 72.0 in | Wt 174.8 lb

## 2014-06-20 DIAGNOSIS — D509 Iron deficiency anemia, unspecified: Secondary | ICD-10-CM

## 2014-06-20 DIAGNOSIS — E7800 Pure hypercholesterolemia, unspecified: Secondary | ICD-10-CM

## 2014-06-20 DIAGNOSIS — I119 Hypertensive heart disease without heart failure: Secondary | ICD-10-CM

## 2014-06-20 DIAGNOSIS — I259 Chronic ischemic heart disease, unspecified: Secondary | ICD-10-CM

## 2014-06-20 LAB — CBC WITH DIFFERENTIAL/PLATELET
BASOS ABS: 0 10*3/uL (ref 0.0–0.1)
BASOS PCT: 0.2 % (ref 0.0–3.0)
EOS ABS: 0.1 10*3/uL (ref 0.0–0.7)
Eosinophils Relative: 2.5 % (ref 0.0–5.0)
HCT: 36 % — ABNORMAL LOW (ref 39.0–52.0)
Hemoglobin: 12.1 g/dL — ABNORMAL LOW (ref 13.0–17.0)
Lymphocytes Relative: 36.3 % (ref 12.0–46.0)
Lymphs Abs: 1.4 10*3/uL (ref 0.7–4.0)
MCHC: 33.5 g/dL (ref 30.0–36.0)
MCV: 85.1 fl (ref 78.0–100.0)
Monocytes Absolute: 0.2 10*3/uL (ref 0.1–1.0)
Monocytes Relative: 6.1 % (ref 3.0–12.0)
NEUTROS PCT: 54.9 % (ref 43.0–77.0)
Neutro Abs: 2.1 10*3/uL (ref 1.4–7.7)
Platelets: 163 10*3/uL (ref 150.0–400.0)
RBC: 4.23 Mil/uL (ref 4.22–5.81)
RDW: 14.7 % (ref 11.5–15.5)
WBC: 3.8 10*3/uL — ABNORMAL LOW (ref 4.0–10.5)

## 2014-06-20 NOTE — Patient Instructions (Signed)
Will obtain labs today and call you with the results (LP/BMET/HFP/CBC)  Your physician recommends that you continue on your current medications as directed. Please refer to the Current Medication list given to you today.  Your physician wants you to follow-up in: 6 months with fasting labs (lp/bmet/hfp) AND EKG  You will receive a reminder letter in the mail two months in advance. If you don't receive a letter, please call our office to schedule the follow-up appointment.

## 2014-06-20 NOTE — Assessment & Plan Note (Signed)
He has a history of leukopenia and iron deficiency anemia.  We will check a CBC today.

## 2014-06-20 NOTE — Assessment & Plan Note (Signed)
The patient is intolerant of statins.  He is on fenofibrate and Zetia.  Lab work is pending.  No myalgias

## 2014-06-20 NOTE — Addendum Note (Signed)
Addended by: Tonita Phoenix on: 06/20/2014 09:18 AM   Modules accepted: Orders

## 2014-06-20 NOTE — Progress Notes (Addendum)
Luis Rowe Date of Birth:  1926/09/05 [facility] [address] [place], [place]  [address] [phone]        Fax   [phone]   History of Present Illness: This pleasant 80 year old gentleman is seen for a scheduled 6 month followup office visit. He has a history of known ischemic heart disease. He had stents in 2001 and again in 2003. His last nuclear stress test on 03/25/10 showed no ischemia and his ejection fraction was 71%. The patient has a history of hypercholesterolemia and a history of BPH. He also has a history of osteoarthritis and underwent right total knee replacement in September 2001. He has a history of chronic anemia.  He is no longer being followed by hematology. Since last visit he has been doing well with no new symptoms.  Since we last saw him he has moved to a retirement center in [place]: [facility].  He has a 1 apartment.  Things are working out well for him.   Current Outpatient Prescriptions  Medication Sig Dispense Refill  . aspirin 325 MG EC tablet Take 325 mg by mouth daily.        . Calcium Carbonate-Vitamin D (CALCIUM-VITAMIN D) 500-200 MG-UNIT per tablet Take 2 tablets by mouth every morning.       . Doxylamine Succinate, Sleep, (SLEEP AID PO) Take by mouth. 1/2 daily       . fenofibrate 160 MG tablet TAKE 1 TABLET DAILY  90 tablet  0  . Magnesium 400 MG CAPS Take by mouth every morning.      . metoprolol succinate (TOPROL XL) 50 MG 24 hr tablet Take 1 tablet (50 mg total) by mouth daily.  90 tablet  3  . Multiple Vitamin (MULTIVITAMIN) tablet Take 1 tablet by mouth daily.        . Multiple Vitamins-Minerals (PRESERVISION AREDS 2 PO) Take by mouth 2 (two) times daily.      . Naproxen Sodium (ALEVE) 220 MG CAPS Take by mouth.        . Omega-3 Fatty Acids (FISH OIL) 1000 MG CAPS Take 1,000 mg by mouth 2 (two) times daily. Taking 1200 daily      . omeprazole (PRILOSEC) 20 MG capsule Take 20 mg by mouth daily.         . polyethylene glycol (MIRALAX / GLYCOLAX) packet Take 17 g by mouth daily.       . Selenium 200 MCG CAPS Take by mouth daily.        . Tamsulosin HCl (FLOMAX) 0.4 MG CAPS Take by mouth 2 (two) times daily. Dr. Vonita Moss      . traMADol (ULTRAM) 50 MG tablet Take 1 tablet (50 mg total) by mouth daily.  90 tablet  3  . ZETIA 10 MG tablet TAKE 1 TABLET DAILY  90 tablet  1   No current facility-administered medications for this visit.    Allergies  Allergen Reactions  . Crestor [Rosuvastatin Calcium]     myalgias  . Lipitor [Atorvastatin Calcium]     myalgias  . Zocor [Simvastatin - High Dose]     myalgias    Patient Active Problem List   Diagnosis Date Noted  . Anemia, iron deficiency 05/17/2012  . Leukopenia 05/10/2012  . Arthritis   . Benign hypertensive heart disease without heart failure 05/07/2011  . Hypercholesterolemia 05/07/2011  . Ischemic heart disease 05/07/2011  . BPH (benign prostatic hyperplasia) 05/07/2011    History  Smoking status  . Former Smoker  . Types: Cigarettes  Smokeless tobacco  . Not on file    History  Alcohol Use: Not on file    No family history on file.  Review of Systems: Constitutional: no fever chills diaphoresis or fatigue or change in weight.  Head and neck: no hearing loss, no epistaxis, no photophobia or visual disturbance. Respiratory: No cough, shortness of breath or wheezing. Cardiovascular: No chest pain peripheral edema, palpitations. Gastrointestinal: No abdominal distention, no abdominal pain, no change in bowel habits hematochezia or melena. Genitourinary: No dysuria, no frequency, no urgency, no nocturia. Musculoskeletal:No arthralgias, no back pain, no gait disturbance or myalgias. Neurological: No dizziness, no headaches, no numbness, no seizures, no syncope, no weakness, no tremors. Hematologic: No lymphadenopathy, no easy bruising. Psychiatric: No confusion, no hallucinations, no sleep disturbance.    Physical  Exam: Filed Vitals:   06/20/14 0851  BP: 140/80  Pulse: 54  The patient appears to be in no distress.  Head and neck exam reveals that the pupils are equal and reactive.  The extraocular movements are full.  There is no scleral icterus.  Mouth and pharynx are benign.  No lymphadenopathy.  No carotid bruits.  The jugular venous pressure is normal.  Thyroid is not enlarged or tender.  Chest is clear to percussion and auscultation.  No rales or rhonchi.  Expansion of the chest is symmetrical.  Heart reveals no abnormal lift or heave.  First and second heart sounds are normal.  There is no murmur gallop rub or click.  The abdomen is soft and nontender.  Bowel sounds are normoactive.  There is no hepatosplenomegaly or mass.  There are no abdominal bruits.  Extremities reveal no phlebitis or edema.  Pedal pulses are good.  There is no cyanosis or clubbing.  Neurologic exam is normal strength and no lateralizing weakness.  No sensory deficits.  Integument reveals no rash    Assessment / Plan: 1.  Ischemic heart disease with stents in 2001 and in 2003. 2. Hypercholesterolemia, intolerant of statins 3. essential hypertension 4. history of BPH 5. history of chronic anemia  Plan: Continue same medication.  He has lost 9 pounds since last visit.  I would like him to maintain current weight.  Recheck in 6 months for office visit EKG lipid panel hepatic function panel and basal metabolic panel.

## 2014-06-20 NOTE — Assessment & Plan Note (Signed)
Blood pressures remained stable on current medication. No shortness of breath.  No dizziness or syncope.

## 2014-06-20 NOTE — Assessment & Plan Note (Signed)
The patient has not had any recurrent chest pain or angina. 

## 2014-06-27 ENCOUNTER — Telehealth: Payer: Self-pay | Admitting: Cardiology

## 2014-06-27 NOTE — Telephone Encounter (Signed)
New message ° ° ° ° °Want lab results °

## 2014-06-27 NOTE — Telephone Encounter (Signed)
Message copied by Burnell Blanks on Tue Jun 27, 2014 12:18 PM ------      Message from: Cassell Clement      Created: Wed Jun 21, 2014  8:04 AM       Please report.  The CBC is stable.  Hemoglobin is 12.1. ------

## 2014-06-27 NOTE — Telephone Encounter (Signed)
Notes Recorded by Burnell Blanks on 06/27/2014 at 12:18 PM Advised patient of lab results. Patient did have orders for lp,bmet,hfp but they were not done at visit and he would prefer to wait until the 6 month ov to have done.

## 2014-06-29 ENCOUNTER — Ambulatory Visit: Payer: Medicare Other | Admitting: Cardiology

## 2014-07-04 ENCOUNTER — Other Ambulatory Visit: Payer: Self-pay | Admitting: Cardiology

## 2014-08-15 ENCOUNTER — Telehealth: Payer: Self-pay

## 2014-08-15 DIAGNOSIS — R52 Pain, unspecified: Secondary | ICD-10-CM

## 2014-08-15 MED ORDER — TRAMADOL HCL 50 MG PO TABS: 50.0000 mg | ORAL_TABLET | Freq: Every day | ORAL | Status: DC

## 2014-08-15 NOTE — Telephone Encounter (Signed)
Ok to fax to [facility] per  Dr. Patty SermonsBrackbill

## 2014-09-20 ENCOUNTER — Telehealth: Payer: Self-pay | Admitting: Cardiology

## 2014-09-20 NOTE — Telephone Encounter (Signed)
New Msg   Spoke with pt and confirmed flu shot was received Nov. 1st at [facility] in [place].

## 2014-11-07 ENCOUNTER — Other Ambulatory Visit: Payer: Self-pay | Admitting: Cardiology

## 2014-12-09 ENCOUNTER — Other Ambulatory Visit: Payer: Self-pay | Admitting: Cardiology

## 2014-12-26 ENCOUNTER — Other Ambulatory Visit: Payer: Self-pay

## 2014-12-26 ENCOUNTER — Ambulatory Visit: Payer: Self-pay | Admitting: Cardiology

## 2015-01-15 ENCOUNTER — Telehealth: Payer: Self-pay

## 2015-01-15 DIAGNOSIS — R52 Pain, unspecified: Secondary | ICD-10-CM

## 2015-01-15 NOTE — Telephone Encounter (Signed)
Rx has to be faxed, will fax when  Dr. Patty SermonsBrackbill is back to sign

## 2015-01-15 NOTE — Telephone Encounter (Signed)
Okay to refill ultram 

## 2015-01-26 MED ORDER — TRAMADOL HCL 50 MG PO TABS: 50.0000 mg | ORAL_TABLET | Freq: Every day | ORAL | Status: DC

## 2015-01-26 NOTE — Addendum Note (Signed)
Addended by: Regis BillPRATT, MELINDA B on: 01/26/2015 02:18 PM   Modules accepted: Orders

## 2015-01-26 NOTE — Telephone Encounter (Signed)
Will have  Dr. Patty SermonsBrackbill sign and fax today, he has been out of the office

## 2015-02-05 ENCOUNTER — Other Ambulatory Visit: Payer: Self-pay | Admitting: Cardiology

## 2015-02-21 ENCOUNTER — Ambulatory Visit (INDEPENDENT_AMBULATORY_CARE_PROVIDER_SITE_OTHER): Payer: Medicare Other | Admitting: Cardiology

## 2015-02-21 ENCOUNTER — Other Ambulatory Visit (INDEPENDENT_AMBULATORY_CARE_PROVIDER_SITE_OTHER): Payer: Medicare Other | Admitting: *Deleted

## 2015-02-21 ENCOUNTER — Encounter: Payer: Self-pay | Admitting: Cardiology

## 2015-02-21 VITALS — BP 146/70 | HR 62 | Ht 72.0 in | Wt 180.8 lb

## 2015-02-21 DIAGNOSIS — I259 Chronic ischemic heart disease, unspecified: Secondary | ICD-10-CM | POA: Diagnosis not present

## 2015-02-21 DIAGNOSIS — I119 Hypertensive heart disease without heart failure: Secondary | ICD-10-CM

## 2015-02-21 DIAGNOSIS — E7800 Pure hypercholesterolemia, unspecified: Secondary | ICD-10-CM

## 2015-02-21 DIAGNOSIS — D509 Iron deficiency anemia, unspecified: Secondary | ICD-10-CM | POA: Diagnosis not present

## 2015-02-21 LAB — BASIC METABOLIC PANEL
BUN: 33 mg/dL — ABNORMAL HIGH (ref 6–23)
CHLORIDE: 103 meq/L (ref 96–112)
CO2: 31 meq/L (ref 19–32)
CREATININE: 1.3 mg/dL (ref 0.40–1.50)
Calcium: 9.6 mg/dL (ref 8.4–10.5)
GFR: 55.5 mL/min — ABNORMAL LOW (ref >60.00)
Glucose, Bld: 91 mg/dL (ref 70–99)
Potassium: 4.3 mEq/L (ref 3.5–5.1)
Sodium: 138 mEq/L (ref 135–145)

## 2015-02-21 LAB — HEPATIC FUNCTION PANEL
ALK PHOS: 96 U/L (ref 39–117)
ALT: 12 U/L (ref 0–53)
AST: 18 U/L (ref 0–37)
Albumin: 4 g/dL (ref 3.5–5.2)
BILIRUBIN DIRECT: 0.1 mg/dL (ref 0.0–0.3)
BILIRUBIN TOTAL: 0.5 mg/dL (ref 0.2–1.2)
Total Protein: 7.4 g/dL (ref 6.0–8.3)

## 2015-02-21 LAB — LIPID PANEL
CHOL/HDL RATIO: 6
CHOLESTEROL: 165 mg/dL (ref 0–200)
HDL: 26.6 mg/dL — AB (ref >39.00)
LDL Cholesterol: 120 mg/dL — ABNORMAL HIGH (ref 0–99)
NonHDL: 138.4
TRIGLYCERIDES: 92 mg/dL (ref 0.0–149.0)
VLDL: 18.4 mg/dL (ref 0.0–40.0)

## 2015-02-21 MED ORDER — FENOFIBRATE 160 MG PO TABS: 160.0000 mg | ORAL_TABLET | Freq: Every day | ORAL | Status: DC

## 2015-02-21 NOTE — Patient Instructions (Signed)
Medication Instructions:  Your physician recommends that you continue on your current medications as directed. Please refer to the Current Medication list given to you today.  Labwork: NONE  Testing/Procedures: NONE  Follow-Up: Your physician wants you to follow-up in: 6 months with fasting labs (lp/bmet/hfp)  You will receive a reminder letter in the mail two months in advance. If you don't receive a letter, please call our office to schedule the follow-up appointment. WILL ARRANGE FOR YOU TO FOLLOW UP WITH DR Mar[place]GOLLAN [place]

## 2015-02-21 NOTE — Progress Notes (Signed)
Quick Note:  Please report to patient. The recent labs are stable. Continue same medication and careful diet. Kidney function is better. ______ 

## 2015-02-21 NOTE — Progress Notes (Signed)
Cardiology Office Note   Date:  02/21/2015   ID:  Luis LaiDavid L Burleson, DOB 02/17/1926, MRN [id number]  PCP:  Ronny FlurryBrackbill, Tom, MD  Cardiologist: Cassell Clementhomas Magaline Steinberg MD  No chief complaint on file.    History of Present Illness: Luis Rowe is a 81 y.o. male who presents for follow-up.  This pleasant 81 year old gentleman is seen for a scheduled 6 month followup office visit. He has a history of known ischemic heart disease. He had stents in 2001 and again in 2003. His last nuclear stress test on 03/25/10 showed no ischemia and his ejection fraction was 71%. The patient has a history of hypercholesterolemia and a history of BPH. He also has a history of osteoarthritis and underwent right total knee replacement in September 2001. He has a history of chronic anemia. He is no longer being followed by hematology. Since last visit he has been doing well with no new symptoms. Since we last saw him he has moved to a [place] center in [place]: [facility]. He has a 1 apartment. Things are working out well for him.  He no longer drives a car.  His daughter has to drive him places.  [place] will be [place] [facility]. Since last visit he has been feeling well.  He has not been experiencing any chest pain or shortness of breath.  No dizziness or syncope.  Past Medical History  Diagnosis Date  . Hyperlipidemia   . Hypertension   . Coronary artery disease   . Arthritis   . Anemia, iron deficiency 05/17/2012    Past Surgical History  Procedure Laterality Date  . Other surgical history      tonsilectomy  . Circumcision  1968  . Cardiac catheterization  05/05/2000    stents  . Cardiac catheterization  01/13/2000    stents and ptca     Current Outpatient Prescriptions  Medication Sig Dispense Refill  . aspirin 325 MG EC tablet Take 325 mg by mouth daily.      . Calcium Carbonate-Vitamin D (CALCIUM-VITAMIN D) 500-200 MG-UNIT per tablet Take 2 tablets by  mouth every morning.     . Doxylamine Succinate, Sleep, (SLEEP AID PO) Take by mouth. 1/2 daily     . ezetimibe (ZETIA) 10 MG tablet Take 10 mg by mouth daily.    . fenofibrate 160 MG tablet Take 1 tablet (160 mg total) by mouth daily. 90 tablet 3  . Magnesium 400 MG CAPS Take by mouth every morning.    . metoprolol succinate (TOPROL-XL) 50 MG 24 hr tablet Take 50 mg by mouth daily. Take with or immediately following a meal.    . Multiple Vitamin (MULTIVITAMIN) tablet Take 1 tablet by mouth daily.      . Multiple Vitamins-Minerals (PRESERVISION AREDS 2 PO) Take by mouth 2 (two) times daily.    . Multiple Vitamins-Minerals (PRESERVISION AREDS PO) Take 2 capsules by mouth daily.    . Naproxen Sodium (ALEVE) 220 MG CAPS Take 1 capsule by mouth daily.     . Omega-3 Fatty Acids (FISH OIL) 1000 MG CAPS Take 1,000 mg by mouth 2 (two) times daily. Taking 1200 daily    . omeprazole (PRILOSEC) 20 MG capsule Take 20 mg by mouth daily.      . polyethylene glycol (MIRALAX / GLYCOLAX) packet Take 17 g by mouth daily.     . Selenium 200 MCG CAPS Take by mouth daily.      . traMADol Janean Sark(ULTRAM)  50 MG tablet Take 1 tablet (50 mg total) by mouth daily. 90 tablet 3   No current facility-administered medications for this visit.    Allergies:   Crestor; Lipitor; and Zocor    Social History:  The patient  reports that he has quit smoking. His smoking use included Cigarettes. He does not have any smokeless tobacco history on file.   Family History:  The patient's family history includes Breast cancer in his mother and sister; Heart disease in his father.    ROS:  Please see the history of present illness.   Otherwise, review of systems are positive for none.   All other systems are reviewed and negative.    PHYSICAL EXAM: VS:  BP 146/70 mmHg  Pulse 62  Ht 6' (1.829 m)  Wt 180 lb 12.8 oz (82.01 kg)  BMI 24.52 kg/m2 , BMI Body mass index is 24.52 kg/(m^2). GEN: Well nourished, well developed, in no acute  distress HEENT: normal Neck: no JVD, carotid bruits, or masses Cardiac: RRR; there is a grade 1/6 systolic ejection murmur at the base.  No rubs, or gallops,no edema  Respiratory:  clear to auscultation bilaterally, normal work of breathing GI: soft, nontender, nondistended, + BS MS: no deformity or atrophy Skin: warm and dry, no rash Neuro:  Strength and sensation are intact Psych: euthymic mood, full affect   EKG:  EKG is ordered today. The ekg ordered today demonstrates normal sinus rhythm with first-degree AV block.  Nonspecific T-wave changes.  Since last tracing of 12/26/13, no significant change   Recent Labs: 06/20/2014: Hemoglobin 12.1*; Platelets 163.0 02/21/2015: ALT 12; BUN 33*; Creatinine 1.30; Potassium 4.3; Sodium 138    Lipid Panel    Component Value Date/Time   CHOL 165 02/21/2015 0834   TRIG 92.0 02/21/2015 0834   HDL 26.60* 02/21/2015 0834   CHOLHDL 6 02/21/2015 0834   VLDL 18.4 02/21/2015 0834   LDLCALC 120* 02/21/2015 0834      Wt Readings from Last 3 Encounters:  02/21/15 180 lb 12.8 oz (82.01 kg)  06/20/14 174 lb 12.8 oz (79.289 kg)  12/26/13 183 lb (83.008 kg)        ASSESSMENT AND PLAN:  1. Ischemic heart disease with stents in 2001 and in 2003. 2. Hypercholesterolemia, intolerant of statins 3. essential hypertension 4. history of BPH 5. history of chronic anemia.  He has previously seen Dr. Myna HidalgoEnnever   Current medicines are reviewed at length with the patient today.  The patient does not have concerns regarding medicines.  The following changes have been made:  no change  Labs/ tests ordered today include:   Orders Placed This Encounter  Procedures  . EKG 12-Lead     Because of transportation issues and his elderly age, he will transition his care to the [facility] office with Dr. Dossie Arbourim Gollan and colleagues.  He should return there in about 6 months for office visit and fasting lipid panel hepatic function panel and basal  metabolic panel.  Karie SchwalbeSigned, Renita Brocks MD 02/21/2015 6:22 PM    [facility] [address], [place], [place]  [address] Phone: [phone]; Fax: [phone]

## 2015-05-08 ENCOUNTER — Other Ambulatory Visit: Payer: Self-pay | Admitting: Cardiology

## 2015-08-07 ENCOUNTER — Telehealth: Payer: Self-pay

## 2015-08-07 DIAGNOSIS — R52 Pain, unspecified: Secondary | ICD-10-CM

## 2015-08-07 MED ORDER — TRAMADOL HCL 50 MG PO TABS: 50.0000 mg | ORAL_TABLET | Freq: Every day | ORAL | Status: DC | PRN

## 2015-08-07 MED ORDER — EZETIMIBE 10 MG PO TABS: 10.0000 mg | ORAL_TABLET | Freq: Every day | ORAL | Status: AC

## 2015-08-07 NOTE — Telephone Encounter (Signed)
Patient is requesting a refill of Tramadol as well as his Zetia.  May I refill the Tramadol?

## 2015-08-07 NOTE — Telephone Encounter (Signed)
Printed for  Dr. Patty SermonsBrackbill to sign when he is back in the office

## 2015-08-07 NOTE — Telephone Encounter (Signed)
Okay to refill tramadol.

## 2015-08-28 ENCOUNTER — Encounter: Payer: Self-pay | Admitting: Cardiovascular Disease

## 2015-08-28 ENCOUNTER — Ambulatory Visit (INDEPENDENT_AMBULATORY_CARE_PROVIDER_SITE_OTHER): Payer: Medicare Other | Admitting: Cardiovascular Disease

## 2015-08-28 VITALS — BP 158/70 | HR 57 | Ht 72.0 in | Wt 186.5 lb

## 2015-08-28 DIAGNOSIS — I2510 Atherosclerotic heart disease of native coronary artery without angina pectoris: Secondary | ICD-10-CM | POA: Insufficient documentation

## 2015-08-28 DIAGNOSIS — E7800 Pure hypercholesterolemia, unspecified: Secondary | ICD-10-CM

## 2015-08-28 DIAGNOSIS — R0602 Shortness of breath: Secondary | ICD-10-CM | POA: Diagnosis not present

## 2015-08-28 DIAGNOSIS — I119 Hypertensive heart disease without heart failure: Secondary | ICD-10-CM

## 2015-08-28 DIAGNOSIS — M1990 Unspecified osteoarthritis, unspecified site: Secondary | ICD-10-CM

## 2015-08-28 DIAGNOSIS — D509 Iron deficiency anemia, unspecified: Secondary | ICD-10-CM

## 2015-08-28 DIAGNOSIS — I259 Chronic ischemic heart disease, unspecified: Secondary | ICD-10-CM | POA: Diagnosis not present

## 2015-08-28 NOTE — Assessment & Plan Note (Signed)
Prior  history of anemia from iron deficiency No recent lab work [facility] [facility] refer to primary care as he does not have one at this time Contact number provided

## 2015-08-28 NOTE — Assessment & Plan Note (Signed)
Prior coronary artery disease and stenting discussed with him He denies any symptoms concerning for angina No further testing Cholesterol management as detailed

## 2015-08-28 NOTE — Assessment & Plan Note (Signed)
arthritis discussed with him Recommended biking and pool aerobics  Low impact exercise

## 2015-08-28 NOTE — Assessment & Plan Note (Signed)
Currently with no symptoms of angina. No further workup at this time. Continue current medication regimen. 

## 2015-08-28 NOTE — Progress Notes (Signed)
Patient ID: Luis Rowe, male    DOB: 04/23/1926, 81 y.o.   MRN: [id number]  HPI Comments: Luis Rowe is a 81 year old gentleman with history of coronary artery disease, stents in 2001 and  in 2003, Hyperlipidemia not on a statin, BPH, osteoarthritis, total knee replacement on the right in 2001, chronic anemia followed by hematology  who presents to [facility] care in [facility]  In follow-up today, he reports that he is doing well. Denies any symptoms of shortness of breath or chest discomfort No regular exercise program. moved to a [place] center in [place]: [facility]. He has a 1 apartment. They do have an exercise facility but he does not use this Reports having previous statin intolerance to Lipitor, Crestor. Does not want to try them again. He is tolerating his zetia,  cholesterol above goal 6 months ago    His last nuclear stress test on 03/25/10 showed no ischemia and his ejection fraction was 71%.    He no longer drives a car.    He has not been experiencing any chest pain or shortness of breath.  No dizziness or syncope.  EKG on today's visit shows normal sinus rhythm with rate 57 bpm, no significant ST or T-wave changes, first-degree AV block         Allergies  Allergen Reactions  . Crestor [Rosuvastatin Calcium]     myalgias  . Lipitor [Atorvastatin Calcium]     myalgias  . Zocor [Simvastatin - High Dose]     myalgias    Current Outpatient Prescriptions on File Prior to Visit  Medication Sig Dispense Refill  . Calcium Carbonate-Vitamin D (CALCIUM-VITAMIN D) 500-200 MG-UNIT per tablet Take 2 tablets by mouth every morning.     . Doxylamine Succinate, Sleep, (SLEEP AID PO) Take by mouth. 1/2 daily     . ezetimibe (ZETIA) 10 MG tablet Take 1 tablet (10 mg total) by mouth daily. 90 tablet 1  . fenofibrate 160 MG tablet Take 1 tablet (160 mg total) by mouth daily. 90 tablet 3  . Magnesium 400 MG CAPS Take by mouth every morning.    .  metoprolol succinate (TOPROL-XL) 50 MG 24 hr tablet Take 50 mg by mouth daily. Take with or immediately following a meal.    . Multiple Vitamin (MULTIVITAMIN) tablet Take 1 tablet by mouth daily.      . Multiple Vitamins-Minerals (PRESERVISION AREDS 2 PO) Take by mouth 2 (two) times daily.    . Naproxen Sodium (ALEVE) 220 MG CAPS Take 1 capsule by mouth daily.     . Omega-3 Fatty Acids (FISH OIL) 1000 MG CAPS Take 1,000 mg by mouth 2 (two) times daily. Taking 1200 daily    . omeprazole (PRILOSEC) 20 MG capsule Take 20 mg by mouth daily.      . polyethylene glycol (MIRALAX / GLYCOLAX) packet Take 17 g by mouth daily.     . Selenium 200 MCG CAPS Take by mouth daily.      . traMADol (ULTRAM) 50 MG tablet Take 1 tablet (50 mg total) by mouth daily as needed for moderate pain. (Patient taking differently: Take 50 mg by mouth daily. ) 90 tablet 1   No current facility-administered medications on file prior to visit.    Past Medical History  Diagnosis Date  . Hyperlipidemia   . Hypertension   . Coronary artery disease   . Arthritis   . Anemia, iron deficiency 05/17/2012    Past Surgical History  Procedure Laterality Date  . Other surgical history      tonsilectomy  . Circumcision  1968  . Cardiac catheterization  05/05/2000    stents  . Cardiac catheterization  01/13/2000    stents and ptca    Social History  reports that he has quit smoking. His smoking use included Cigarettes. He does not have any smokeless tobacco history on file. He reports that he does not drink alcohol or use illicit drugs.  Family History family history includes Breast cancer in his mother and sister; Heart disease in his father.   Review of Systems  Constitutional: Negative.   Respiratory: Negative.   Cardiovascular: Negative.   Gastrointestinal: Negative.   Endocrine: Negative.   Musculoskeletal: Negative.   Neurological: Negative.   Hematological: Negative.   Psychiatric/Behavioral: Negative.   All  other systems reviewed and are negative.   BP 158/70 mmHg  Pulse 57  Ht 6' (1.829 m)  Wt 186 lb 8 oz (84.596 kg)  BMI 25.29 kg/m2  Physical Exam  Constitutional: He is oriented to person, place, and time. He appears well-developed and well-nourished.  HENT:  Head: Normocephalic.  Nose: Nose normal.  Mouth/Throat: Oropharynx is clear and moist.  Eyes: Conjunctivae are normal. Pupils are equal, round, and reactive to light.  Neck: Normal range of motion. Neck supple. No JVD present.  Cardiovascular: Normal rate, regular rhythm and intact distal pulses.  Exam reveals no gallop and no friction rub.   Murmur heard.  Systolic murmur is present with a grade of 1/6  Pulmonary/Chest: Effort normal and breath sounds normal. No respiratory distress. He has no wheezes. He has no rales. He exhibits no tenderness.  Abdominal: Soft. Bowel sounds are normal. He exhibits no distension. There is no tenderness.  Musculoskeletal: Normal range of motion. He exhibits no edema or tenderness.  Lymphadenopathy:    He has no cervical adenopathy.  Neurological: He is alert and oriented to person, place, and time. Coordination normal.  Skin: Skin is warm and dry. No rash noted. No erythema.  Psychiatric: He has a normal mood and affect. His behavior is normal. Judgment and thought content normal.

## 2015-08-28 NOTE — Assessment & Plan Note (Signed)
Cholesterol above goal Recommended we recheck his lipid panel If LDL continues to run above goal, could consider low-dose statin 2 or 3 days per week He would consider this Continue Zetia daily

## 2015-08-28 NOTE — Patient Instructions (Addendum)
You are doing well. No medication changes were made.  We will give you the order form for lipids and liver labs, fasting  Please monitor your blood pressure  If you would like a cholesterol medication 2 to 3 days per week, call the office   Lots of biking, pool workout.  Please call us if you have new issues that need to be addressed before your next appt.  Your physician wants you to follow-up in: 6 months.  You will receive a reminder letter in the mail two months in advance. If you don't receive a letter, please call our office to schedule the follow-up appointment.

## 2015-09-07 ENCOUNTER — Other Ambulatory Visit: Payer: Self-pay

## 2015-09-07 DIAGNOSIS — R0602 Shortness of breath: Secondary | ICD-10-CM

## 2015-09-07 DIAGNOSIS — I259 Chronic ischemic heart disease, unspecified: Secondary | ICD-10-CM

## 2015-09-07 DIAGNOSIS — I119 Hypertensive heart disease without heart failure: Secondary | ICD-10-CM

## 2015-09-12 ENCOUNTER — Encounter: Payer: Self-pay | Admitting: Emergency Medicine

## 2015-09-12 ENCOUNTER — Emergency Department: Payer: Medicare Other

## 2015-09-12 ENCOUNTER — Emergency Department
Admission: EM | Admit: 2015-09-12 | Discharge: 2015-09-12 | Disposition: A | Discharge status: Home / Self Care | Payer: Medicare Other | Attending: Emergency Medicine | Admitting: Emergency Medicine

## 2015-09-12 DIAGNOSIS — Y998 Other external cause status: Secondary | ICD-10-CM | POA: Diagnosis not present

## 2015-09-12 DIAGNOSIS — Z79899 Other long term (current) drug therapy: Secondary | ICD-10-CM | POA: Insufficient documentation

## 2015-09-12 DIAGNOSIS — Z87891 Personal history of nicotine dependence: Secondary | ICD-10-CM | POA: Diagnosis not present

## 2015-09-12 DIAGNOSIS — S2241XA Multiple fractures of ribs, right side, initial encounter for closed fracture: Secondary | ICD-10-CM | POA: Insufficient documentation

## 2015-09-12 DIAGNOSIS — Z7982 Long term (current) use of aspirin: Secondary | ICD-10-CM | POA: Diagnosis not present

## 2015-09-12 DIAGNOSIS — Y9289 Other specified places as the place of occurrence of the external cause: Secondary | ICD-10-CM | POA: Diagnosis not present

## 2015-09-12 DIAGNOSIS — S2231XA Fracture of one rib, right side, initial encounter for closed fracture: Secondary | ICD-10-CM

## 2015-09-12 DIAGNOSIS — W01198A Fall on same level from slipping, tripping and stumbling with subsequent striking against other object, initial encounter: Secondary | ICD-10-CM | POA: Diagnosis not present

## 2015-09-12 DIAGNOSIS — Z791 Long term (current) use of non-steroidal anti-inflammatories (NSAID): Secondary | ICD-10-CM | POA: Diagnosis not present

## 2015-09-12 DIAGNOSIS — I119 Hypertensive heart disease without heart failure: Secondary | ICD-10-CM | POA: Diagnosis not present

## 2015-09-12 DIAGNOSIS — Y9389 Activity, other specified: Secondary | ICD-10-CM | POA: Insufficient documentation

## 2015-09-12 DIAGNOSIS — S299XXA Unspecified injury of thorax, initial encounter: Secondary | ICD-10-CM | POA: Diagnosis present

## 2015-09-12 MED ORDER — IBUPROFEN 600 MG PO TABS
600.0000 mg | ORAL_TABLET | Freq: Once | ORAL | Status: AC
  Administered 2015-09-12: 600 mg via ORAL
  Filled 2015-09-12: qty 1

## 2015-09-12 MED ORDER — HYDROMORPHONE HCL 2 MG PO TABS
4.0000 mg | ORAL_TABLET | Freq: Once | ORAL | Status: AC
  Administered 2015-09-12: 4 mg via ORAL
  Filled 2015-09-12: qty 2

## 2015-09-12 MED ORDER — HYDROMORPHONE HCL 2 MG PO TABS: 2.0000 mg | ORAL_TABLET | Freq: Four times a day (QID) | ORAL | Status: AC | PRN

## 2015-09-12 MED ORDER — IBUPROFEN 600 MG PO TABS: 600.0000 mg | ORAL_TABLET | Freq: Three times a day (TID) | ORAL | Status: DC | PRN

## 2015-09-12 NOTE — Discharge Instructions (Signed)
Rib Fracture A rib fracture is a break or crack in one of the bones of the ribs. The ribs are a group of long, curved bones that wrap around your chest and attach to your spine. They protect your lungs and other organs in the chest cavity. A broken or cracked rib is often painful, but most do not cause other problems. Most rib fractures heal on their own over time. However, rib fractures can be more serious if multiple ribs are broken or if broken ribs move out of place and push against other structures. CAUSES   A direct blow to the chest. For example, this could happen during contact sports, a car accident, or a fall against a hard object.  Repetitive movements with high force, such as pitching a baseball or having severe coughing spells. SYMPTOMS   Pain when you breathe in or cough.  Pain when someone presses on the injured area. DIAGNOSIS  Your caregiver will perform a physical exam. Various imaging tests may be ordered to confirm the diagnosis and to look for related injuries. These tests may include a chest X-ray, computed tomography (CT), magnetic resonance imaging (MRI), or a bone scan. TREATMENT  Rib fractures usually heal on their own in 1-3 months. The longer healing period is often associated with a continued cough or other aggravating activities. During the healing period, pain control is very important. Medication is usually given to control pain. Hospitalization or surgery may be needed for more severe injuries, such as those in which multiple ribs are broken or the ribs have moved out of place.  HOME CARE INSTRUCTIONS   Avoid strenuous activity and any activities or movements that cause pain. Be careful during activities and avoid bumping the injured rib.  Gradually increase activity as directed by your caregiver.  Only take over-the-counter or prescription medications as directed by your caregiver. Do not take other medications without asking your caregiver first.  Apply ice  to the injured area for the first 1-2 days after you have been treated or as directed by your caregiver. Applying ice helps to reduce inflammation and pain.  Put ice in a plastic bag.  Place a towel between your skin and the bag.   Leave the ice on for 15-20 minutes at a time, every 2 hours while you are awake.  Perform deep breathing as directed by your caregiver. This will help prevent pneumonia, which is a common complication of a broken rib. Your caregiver may instruct you to:  Take deep breaths several times a day.  Try to cough several times a day, holding a pillow against the injured area.  Use a device called an incentive spirometer to practice deep breathing several times a day.  Drink enough fluids to keep your urine clear or pale yellow. This will help you avoid constipation.   Do not wear a rib belt or binder. These restrict breathing, which can lead to pneumonia.  SEEK IMMEDIATE MEDICAL CARE IF:   You have a fever.   You have difficulty breathing or shortness of breath.   You develop a continual cough, or you cough up thick or bloody sputum.  You feel sick to your stomach (nausea), throw up (vomit), or have abdominal pain.   You have worsening pain not controlled with medications.  MAKE SURE YOU:  Understand these instructions.  Will watch your condition.  Will get help right away if you are not doing well or get worse.   This information is not intended to  replace advice given to you by your health care provider. Make sure you discuss any questions you have with your health care provider.   Document Released: 10/06/2005 Document Revised: 06/08/2013 Document Reviewed: 12/08/2012 Elsevier Interactive Patient Education [employer]2016 [employer]  Please return immediately if condition worsens. Please contact her primary physician or the physician you were given for referral. If you have any specialist physicians involved in her treatment and plan please also  contact them. Thank you for using [facility]. Please periodically take a deep breath and/or cough. Return emergency department especially for increasing abdominal pain, shortness of breath, fever, productive cough, or any other new concerns.

## 2015-09-12 NOTE — ED Notes (Signed)
Patient transported to X-ray 

## 2015-09-12 NOTE — ED Provider Notes (Signed)
Time Seen: Approximately 1531  I have reviewed the triage notes  Chief Complaint: Fall and Shortness of Breath   History of Present Illness: Luis Rowe is a 81 y.o. male who states he had a non-syncopal fall yesterday at approximately noon. Patient states he was bending over and lost his balance and struck his right side of his chest against a sharp side of a column. Patient denies any other trauma such as head injury, neck, thoracic, lumbar spine pain. The patient states exclusively right-sided chest pain with movement and deep inspiration. He denies any true shortness of breath to this historian. He denies any abdominal pain or contusions.   Past Medical History  Diagnosis Date  . Hyperlipidemia   . Hypertension   . Coronary artery disease   . Arthritis   . Anemia, iron deficiency 05/17/2012    Patient Active Problem List   Diagnosis Date Noted  . CAD (coronary artery disease) 08/28/2015  . Anemia, iron deficiency 05/17/2012  . Leukopenia 05/10/2012  . Arthritis   . Benign hypertensive heart disease without heart failure 05/07/2011  . Hypercholesterolemia 05/07/2011  . Ischemic heart disease 05/07/2011  . BPH (benign prostatic hyperplasia) 05/07/2011    Past Surgical History  Procedure Laterality Date  . Other surgical history      tonsilectomy  . Circumcision  1968  . Cardiac catheterization  05/05/2000    stents  . Cardiac catheterization  01/13/2000    stents and ptca  . Joint replacement Left     knee    Past Surgical History  Procedure Laterality Date  . Other surgical history      tonsilectomy  . Circumcision  1968  . Cardiac catheterization  05/05/2000    stents  . Cardiac catheterization  01/13/2000    stents and ptca  . Joint replacement Left     knee    Current Outpatient Rx  Name  Route  Sig  Dispense  Refill  . aspirin EC 81 MG tablet   Oral   Take 81 mg by mouth daily.         . Calcium Carbonate-Vitamin D (CALCIUM-VITAMIN D) 500-200  MG-UNIT per tablet   Oral   Take 1 tablet by mouth 2 (two) times daily.          . Doxylamine Succinate, Sleep, (SLEEP AID PO)   Oral   Take 1 tablet by mouth at bedtime. 1/2 daily         . ezetimibe (ZETIA) 10 MG tablet   Oral   Take 1 tablet (10 mg total) by mouth daily.   90 tablet   1   . fenofibrate (TRICOR) 145 MG tablet   Oral   Take 145 mg by mouth daily.         . Magnesium 400 MG CAPS   Oral   Take 400 mg by mouth daily.          . metoprolol succinate (TOPROL-XL) 50 MG 24 hr tablet   Oral   Take 50 mg by mouth daily.          . Multiple Vitamin (MULTIVITAMIN) tablet   Oral   Take 1 tablet by mouth daily.           . Multiple Vitamins-Minerals (PRESERVISION AREDS 2 PO)   Oral   Take 1 tablet by mouth 2 (two) times daily.          . Naproxen Sodium (ALEVE) 220 MG CAPS   Oral  Take 1 capsule by mouth daily.          . Omega-3 Fatty Acids (FISH OIL) 1000 MG CAPS   Oral   Take 1,000 mg by mouth 2 (two) times daily.          Marland Kitchen omeprazole (PRILOSEC) 20 MG capsule   Oral   Take 20 mg by mouth daily.           . polyethylene glycol (MIRALAX / GLYCOLAX) packet   Oral   Take 17 g by mouth daily.          . Selenium 200 MCG CAPS   Oral   Take 200 mcg by mouth daily.          . traMADol (ULTRAM) 50 MG tablet   Oral   Take 1 tablet (50 mg total) by mouth daily as needed for moderate pain. Patient taking differently: Take 50 mg by mouth daily.    90 tablet   1   . fenofibrate 160 MG tablet   Oral   Take 1 tablet (160 mg total) by mouth daily. Patient not taking: Reported on 09/12/2015   90 tablet   3   . HYDROmorphone (DILAUDID) 2 MG tablet   Oral   Take 1 tablet (2 mg total) by mouth every 6 (six) hours as needed for severe pain.   28 tablet   0   . ibuprofen (ADVIL,MOTRIN) 600 MG tablet   Oral   Take 1 tablet (600 mg total) by mouth every 8 (eight) hours as needed.   30 tablet   1     Allergies:  Crestor;  Lipitor; and Zocor  Family History: Family History  Problem Relation Age of Onset  . Breast cancer Mother   . Heart disease Father   . Breast cancer Sister     Social History: Social History  Substance Use Topics  . Smoking status: Former Smoker    Types: Cigarettes  . Smokeless tobacco: None  . Alcohol Use: No     Review of Systems:   10 point review of systems was performed and was otherwise negative:  Constitutional: No fever Eyes: No visual disturbances ENT: No sore throat, ear pain Cardiac: Exclusively right-sided chest pain Respiratory: No shortness of breath, wheezing, or stridor Abdomen: No abdominal pain, no vomiting, No diarrhea Endocrine: No weight loss, No night sweats Extremities: No peripheral edema, cyanosis Skin: No rashes, easy bruising Neurologic: No focal weakness, trouble with speech or swollowing Urologic: No dysuria, Hematuria, or urinary frequency   Physical Exam:  ED Triage Vitals  Enc Vitals Group     BP 09/12/15 1238 168/63 mmHg     Pulse Rate 09/12/15 1238 80     Resp 09/12/15 1238 16     Temp 09/12/15 1238 97.3 F (36.3 C)     Temp Source 09/12/15 1238 Oral     SpO2 09/12/15 1238 95 %     Weight 09/12/15 1238 185 lb (83.915 kg)     Height 09/12/15 1238 6' (1.829 m)     Head Cir --      Peak Flow --      Pain Score 09/12/15 1239 7     Pain Loc --      Pain Edu? --      Excl. in GC? --     General: Awake , Alert , and Oriented times 3; GCS 15 Head: Normal cephalic , atraumatic Eyes: Pupils equal , round, reactive to light Nose/Throat: No  nasal drainage, patent upper airway without erythema or exudate.  Neck: Supple, Full range of motion, No anterior adenopathy or palpable thyroid masses Lungs: Clear to ascultation without wheezes , rhonchi, or rales Heart: Regular rate, regular rhythm without murmurs , gallops , or rubs Abdomen: Soft, non tender without rebound, guarding , or rigidity; bowel sounds positive and symmetric in  all 4 quadrants. No organomegaly .   No splenic tenderness     Extremities: 2 plus symmetric pulses. No edema, clubbing or cyanosis Neurologic: normal ambulation, Motor symmetric without deficits, sensory intact Skin: warm, dry, no rashes Chest wall palpation shows no crepitus or step-off noted, no contusions or abrasions noted with tenderness throughout the T6-T12 region.    EKG: ED ECG REPORT I, Jennye Moccasin, the attending physician, personally viewed and interpreted this ECG.  Date: 09/12/2015 EKG Time: 1242 Rate: 77 Rhythm: normal sinus rhythm QRS Axis: normal Intervals: normal ST/T Wave abnormalities: normal Conduction Disutrbances: none Narrative Interpretation: unremarkable    Radiology:   CLINICAL DATA: Lightheaded yesterday with fall, right lower anterior rib pain.  EXAM: RIGHT RIBS AND CHEST - 3+ VIEW  COMPARISON: None.  FINDINGS: Single view of the chest and three views of the right ribs are provided. There are displaced fractures of the right posterior-lateral eighth and ninth ribs. No associated pleural effusion or pneumothorax identified.  Chronic -appearing interstitial markings within each lung are most likely indicative of chronic interstitial lung disease. No confluent opacity to suggest pneumonia.  Incidental note made of a moderate-sized hiatal hernia. Heart size remains normal and overall cardiomediastinal silhouette is stable in size and configuration.  IMPRESSION: 1. Displaced fractures of the right posterior-lateral eighth and ninth ribs. 2. No associated pneumothorax seen. 3. Chronic-appearing interstitial lung disease/fibrosis.     I personally reviewed the radiologic studies   P ED Course: * Patient's stay was uneventful is pulse ox remained stable and he was able to show deep inspiratory effort and cough with pain though otherwise was able to tolerate it. He does not show any signs by the radiologic or exam of pneumonia or  hemopneumothorax. No anterior abdominal pain with some I felt no clinical risk for liver trauma. Patient's was given ibuprofen and Dilaudid here in emergency department felt symptomatic improvement. He will be discharged on similar with discussed stool softeners etc. due to the initiation of narcotic pain medication. Patient was advised periodically take a deep breath so he does not get atelectasis and sequelae of pneumonia. All questions and concerns were addressed with his family present.    Assessment:  Right-sided rib fractures  Final Clinical Impression:  Final diagnoses:  Rib fractures, right, closed, initial encounter     Plan:  Outpatient management Patient was advised to return immediately if condition worsens. Patient was advised to follow up with her primary care physician or other specialized physicians involved and in their current assessment.            Jennye Moccasin, MD 09/12/15 [phone]

## 2015-09-12 NOTE — ED Notes (Signed)
Patient presents to the ED post fall yesterday at noon.  Patient reports bending over and losing his balance, hitting his right side on a "column".  Patient denies hitting his head and denies losing consciousness.  Patient reports difficulty breathing deeply and pain when he coughs.  No obvious bruising noted to the front of patient's chest.

## 2015-09-25 ENCOUNTER — Ambulatory Visit
Admission: RE | Admit: 2015-09-25 | Discharge: 2015-09-25 | Disposition: A | Discharge status: Home / Self Care | Payer: Medicare Other | Source: Ambulatory Visit | Attending: Family Medicine | Admitting: Family Medicine

## 2015-09-25 ENCOUNTER — Encounter: Payer: Self-pay | Admitting: Family Medicine

## 2015-09-25 ENCOUNTER — Ambulatory Visit (INDEPENDENT_AMBULATORY_CARE_PROVIDER_SITE_OTHER): Payer: Medicare Other | Admitting: Family Medicine

## 2015-09-25 VITALS — BP 122/60 | HR 70 | Temp 97.8°F | Ht 71.0 in | Wt 183.0 lb

## 2015-09-25 DIAGNOSIS — I517 Cardiomegaly: Secondary | ICD-10-CM | POA: Insufficient documentation

## 2015-09-25 DIAGNOSIS — S2249XA Multiple fractures of ribs, unspecified side, initial encounter for closed fracture: Secondary | ICD-10-CM | POA: Insufficient documentation

## 2015-09-25 DIAGNOSIS — J849 Interstitial pulmonary disease, unspecified: Secondary | ICD-10-CM | POA: Insufficient documentation

## 2015-09-25 DIAGNOSIS — W19XXXA Unspecified fall, initial encounter: Secondary | ICD-10-CM | POA: Insufficient documentation

## 2015-09-25 DIAGNOSIS — S2231XA Fracture of one rib, right side, initial encounter for closed fracture: Secondary | ICD-10-CM

## 2015-09-25 DIAGNOSIS — S2241XA Multiple fractures of ribs, right side, initial encounter for closed fracture: Secondary | ICD-10-CM

## 2015-09-25 DIAGNOSIS — K449 Diaphragmatic hernia without obstruction or gangrene: Secondary | ICD-10-CM | POA: Diagnosis not present

## 2015-09-25 MED ORDER — HYDROMORPHONE HCL 2 MG PO TABS: 2.0000 mg | ORAL_TABLET | Freq: Four times a day (QID) | ORAL | Status: DC | PRN

## 2015-09-25 NOTE — Assessment & Plan Note (Addendum)
Patient presents as a new patient for the follow-up rib fractures. He appears comfortable in the room. Notes pain has not been well controlled with Vicodin. Given his exam findings of persistent tenderness, crackles on the right lung base, and some bruising we will repeat the chest x-ray with rib films to evaluate this to ensure that there are no lung abnormalities and no effusions. We will change him to by mouth Dilaudid for pain. I did discuss potential side effects of this medicine and patient was made aware that he is to monitor for drowsiness. He will discontinue the Vicodin. Discussed that NSAIDs can inhibit fracture healing and patient reports that ibuprofen has not been much help. He'll discontinue this medicine. He notes he is particularly uncomfortable when trying to sleep at night and thinks a hospital bed may be more beneficial. I wrote a prescription for this and we will see if that will help. He will do an incentive spirometer at home as well to ensure that his lungs expand adequately. Patient was given return precautions.

## 2015-09-25 NOTE — Progress Notes (Signed)
Pre visit review using our clinic review tool, if applicable. No additional management support is needed unless otherwise documented below in the visit note. 

## 2015-09-25 NOTE — Patient Instructions (Signed)
Nice to meet you. Please go get the x-ray of her right ribs to follow-up on this. Please stop the Vicodin and ibuprofen. We will refill your Dilaudid. Please start taking this as prescribed. You need to use the incentive spirometer at least on an hourly basis while you're awake. If you develop any increasing pain, change in bruising, shortness of breath, chest pain, fever, cough, or any new or change in symptoms please seek medical attention.

## 2015-09-25 NOTE — Progress Notes (Signed)
Patient ID: Luis Rowe, male   DOB: 06/17/1926, 81 y.o.   MRN: [id number]  Luis AlarEric Abrahm Mancia, MD Phone: [phone]  Luis LaiDavid L Rowe is a 81 y.o. male who presents today for new patient visit.  Right rib fractures: Patient notes on 11/22 he walked to his door and bent over to pick something up. He notes he then fell into the side of his right rib cage on the door jam. He notes he did not feel lightheaded. He assumes he was lightheaded though he denies feeling the sensation. He did not have any other symptoms with this. He did not lose consciousness. He did not injure his head. He did not have any numbness or weakness. Did not have any chest pain, shortness of breath, or palpitations. He had pain in his ribs at that time. He waited until the next day to go to the emergency room to be evaluated. It was noted in the ED that he had several rib fractures on x-ray. He is noted to have rib fractures in the seventh, eighth, ninth, and 10th ribs. He had chronic appearing interstitial markings in each lung as well. He notes he was given pain medication for this. He notes he later followed up in urgent care with a Dr. Linde GillisMaynard and was treated with Levaquin as they thought they saw some fluid on his lungs. He just finished the Levaquin prescription. Not had any fevers. He has been intermittently taking ibuprofen for this. He has been taking Vicodin twice daily for this. He had been taking oral diet wanted, though stopped this when he started the Vicodin. He notes Vicodin does not help with his pain very much. He has not had any chest pain, shortness of breath, coughing up blood, abdominal pain, difficulty breathing, or any new symptoms since this occurred. He does note he developed some bruising below the area of his rib fractures on his side. He does note persistent intermittent rib pain.  Active Ambulatory Problems    Diagnosis Date Noted  . Benign hypertensive heart disease without heart failure 05/07/2011  .  Hypercholesterolemia 05/07/2011  . Ischemic heart disease 05/07/2011  . BPH (benign prostatic hyperplasia) 05/07/2011  . Arthritis   . Leukopenia 05/10/2012  . Anemia, iron deficiency 05/17/2012  . CAD (coronary artery disease) 08/28/2015  . Multiple rib fractures 09/25/2015   Resolved Ambulatory Problems    Diagnosis Date Noted  . No Resolved Ambulatory Problems   Past Medical History  Diagnosis Date  . Hyperlipidemia   . Hypertension   . Coronary artery disease     Family History  Problem Relation Age of Onset  . Breast cancer Mother   . Heart disease Father   . Breast cancer Sister     Social History   Social History  . Marital Status: Widowed    Spouse Name: N/A  . Number of Children: N/A  . Years of Education: N/A   Occupational History  . Not on file.   Social History Main Topics  . Smoking status: Former Smoker    Types: Cigarettes  . Smokeless tobacco: Not on file  . Alcohol Use: No  . Drug Use: No  . Sexual Activity: Not on file   Other Topics Concern  . Not on file   Social History Narrative    ROS   General:  Negative for unexplained weight loss, fever Skin: Negative for new or changing mole, sore that won't heal HEENT: Negative for trouble hearing, trouble seeing, ringing in ears, mouth  sores, hoarseness, change in voice, dysphagia. CV:  Negative for chest pain, dyspnea, edema, palpitations Resp: Negative for cough, dyspnea, hemoptysis GI: Negative for nausea, vomiting, diarrhea, constipation, abdominal pain, melena, hematochezia. GU: Negative for dysuria, incontinence, urinary hesitance, hematuria, vaginal or penile discharge, polyuria, sexual difficulty, lumps in testicle or breasts MSK: Negative for muscle cramps or aches, joint pain or swelling Neuro: Negative for headaches, weakness, numbness, dizziness, passing out/fainting Psych: Positive for anxiety, Negative for depression, memory problems  Objective  Physical Exam Filed Vitals:    09/25/15 1001  BP: 122/60  Pulse: 70  Temp: 97.8 F (36.6 C)    Physical Exam  Constitutional: He is well-developed, well-nourished, and in no distress.  Appears comfortable  HENT:  Head: Normocephalic and atraumatic.  Right Ear: External ear normal.  Left Ear: External ear normal.  Mouth/Throat: Oropharynx is clear and moist. No oropharyngeal exudate.  Eyes: Conjunctivae are normal. Pupils are equal, round, and reactive to light.  Neck: Neck supple.  Cardiovascular: Normal rate, regular rhythm and normal heart sounds.  Exam reveals no gallop and no friction rub.   No murmur heard. Pulmonary/Chest: Effort normal. No respiratory distress. He has no wheezes. He has no rales.  Minimal right-sided basilar crackles, no evidence of flail chest on exam  Musculoskeletal:  There is tenderness of the right-sided ribs along the midaxillary portion from the midportion of the ribs down to the 10th rib, there is bruising just inferior to this that is nontender and is not firm, there is no flank tenderness  Lymphadenopathy:    He has no cervical adenopathy.  Neurological: He is alert. Gait normal.  Skin: Skin is warm and dry. He is not diaphoretic.     Assessment/Plan:   Multiple rib fractures Patient presents as a new patient for the follow-up rib fractures. He appears comfortable in the room. Notes pain has not been well controlled with Vicodin. Given his exam findings of persistent tenderness, crackles on the right lung base, and some bruising we will repeat the chest x-ray with rib films to evaluate this to ensure that there are no lung abnormalities and no effusions. We will change him to by mouth Dilaudid for pain. I did discuss potential side effects of this medicine and patient was made aware that he is to monitor for drowsiness. He will discontinue the Vicodin. Discussed that NSAIDs can inhibit fracture healing and patient reports that ibuprofen has not been much help. He'll discontinue  this medicine. He notes he is particularly uncomfortable when trying to sleep at night and thinks a hospital bed may be more beneficial. I wrote a prescription for this and we will see if that will help. He will do an incentive spirometer at home as well to ensure that his lungs expand adequately. Patient was given return precautions.    Orders Placed This Encounter  Procedures  . DG Ribs Unilateral W/Chest Right    Standing Status: Future     Number of Occurrences: 1     Standing Expiration Date: 11/25/2016    Order Specific Question:  Reason for Exam (SYMPTOM  OR DIAGNOSIS REQUIRED)    Answer:  right rib fractures    Order Specific Question:  Preferred imaging location?    Answer:  [facility]

## 2015-10-10 ENCOUNTER — Encounter: Payer: Self-pay | Admitting: Family Medicine

## 2015-10-10 ENCOUNTER — Ambulatory Visit (INDEPENDENT_AMBULATORY_CARE_PROVIDER_SITE_OTHER): Payer: Medicare Other | Admitting: Family Medicine

## 2015-10-10 ENCOUNTER — Telehealth: Payer: Self-pay | Admitting: General Practice

## 2015-10-10 VITALS — BP 144/78 | HR 65 | Temp 97.7°F | Ht 71.0 in | Wt 179.6 lb

## 2015-10-10 DIAGNOSIS — R52 Pain, unspecified: Secondary | ICD-10-CM

## 2015-10-10 DIAGNOSIS — S2241XA Multiple fractures of ribs, right side, initial encounter for closed fracture: Secondary | ICD-10-CM | POA: Diagnosis not present

## 2015-10-10 DIAGNOSIS — H3530 Unspecified macular degeneration: Secondary | ICD-10-CM | POA: Insufficient documentation

## 2015-10-10 MED ORDER — TRAMADOL HCL 50 MG PO TABS: 50.0000 mg | ORAL_TABLET | Freq: Three times a day (TID) | ORAL | Status: DC | PRN

## 2015-10-10 NOTE — Patient Instructions (Signed)
Nice to see you. We will change you to tramadol for pain. Please monitor for confusion and constipation on this. You should take a stool softener with this.  Please continue to take deep breaths as often as possible.  I will complete your [facility] paper work and we will call you when it is completed.  If you develop confusion, numbness, weakness, shortness of breath, chest pain, increased rib pain, or any new or change in symptoms please seek medical attention.

## 2015-10-10 NOTE — Progress Notes (Signed)
Patient ID: Luis LaiDavid L Rowe, male   DOB: 09/03/1926, 81 y.o.   MRN: [id number]  Luis AlarEric Renso Swett, MD Phone: [phone]  Luis Rowe is a 81 y.o. male who presents today for follow-up.  Rib fractures: Patient notes these are improved. He still mildly uncomfortable mostly at night because he has sleep on his back. Notes pain level is less. Typically 6.5/10. Has been taking the hydromorphone 1 tablet about every 8 hours. He is unsure if this is helping. He does occasionally get fuzzy with this. He has been constipated as well with this. He has been taking stool softener that helps. He notes no shortness of breath or chest pain. He has been inhaling deeply 3-4 times a day.  Patient also reports he needs VA benefit paperwork completed. 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 Notes he can't see well enough to do his own medications. He does note memory issues with difficulty remembering short-term things though his long-term memory is stable.  PMH: Former smoker.   ROS see history of present illness  Objective  Physical Exam Filed Vitals:   10/10/15 1028  BP: 144/78  Pulse: 65  Temp: 97.7 F (36.5 C)    Physical Exam  Constitutional: He is well-developed, well-nourished, and in no distress.  HENT:  Head: Normocephalic and atraumatic.  Right Ear: External ear normal.  Left Ear: External ear normal.  Mouth/Throat: Oropharynx is clear and moist. No oropharyngeal exudate.  Eyes: Conjunctivae are normal. Pupils are equal, round, and reactive to light.  Neck: Neck supple.  Cardiovascular: Normal rate, regular rhythm and normal heart sounds.  Exam reveals no gallop and no friction rub.   No murmur heard. Pulmonary/Chest: Effort normal. He exhibits no tenderness.  Minimal bibasilar crackles, no wheezes, no tenderness to his  right ribs, no evidence of flail chest, no bruising in his right flank  Lymphadenopathy:    He has no cervical adenopathy.  Neurological: He is alert.  Lacks vision in his left eye, otherwise CN 2-12 intact, 5/5 strength in bilateral biceps, triceps, grip, quads, hamstrings, plantar and dorsiflexion, sensation to light touch intact in bilateral UE and LE, normal gait, 2+ patellar reflexes  Skin: Skin is warm and dry. He is not diaphoretic.     Assessment/Plan: Please see individual problem list.  Multiple rib fractures Overall appears to be healing well. Still with some pain. Given mild fuzziness and constipation on hydromorphone we will discontinue this and change him to tramadol. He notes he has been taking tramadol once nightly. He will take this every 8 hours as needed for discomfort. Reinforced that he needs to take deep breaths daily. We will continue to monitor. He is given return precautions.  Macular degeneration Has macular degeneration of left eye. Notes this keeps him from preparing his own meals and taking care of his own medications. He needs VA paperwork filled out stating that he meets assisted living requirements. This paperwork will be filled out. He will continue to follow-up with his ophthalmologist. We will request records    Meds ordered this encounter  Medications  . traMADol (ULTRAM) 50 MG tablet    Sig: Take 1 tablet (50 mg total) by mouth every 8 (eight) hours as needed for moderate pain.    Dispense:  180 tablet    Refill:  1   Dragon voice recognition software was used during the dictation process of this note. If any phrases or words seem inappropriate it is likely secondary to the translation process being inefficient.  Luis Rowe

## 2015-10-10 NOTE — Progress Notes (Signed)
Pre visit review using our clinic review tool, if applicable. No additional management support is needed unless otherwise documented below in the visit note. 

## 2015-10-10 NOTE — Assessment & Plan Note (Signed)
Has macular degeneration of left eye. Notes this keeps him from preparing his own meals and taking care of his own medications. He needs VA paperwork filled out stating that he meets assisted living requirements. This paperwork will be filled out. He will continue to follow-up with his ophthalmologist. We will request records

## 2015-10-10 NOTE — Assessment & Plan Note (Signed)
Overall appears to be healing well. Still with some pain. Given mild fuzziness and constipation on hydromorphone we will discontinue this and change him to tramadol. He notes he has been taking tramadol once nightly. He will take this every 8 hours as needed for discomfort. Reinforced that he needs to take deep breaths daily. We will continue to monitor. He is given return precautions.

## 2015-11-06 ENCOUNTER — Ambulatory Visit (INDEPENDENT_AMBULATORY_CARE_PROVIDER_SITE_OTHER): Payer: Medicare Other | Admitting: Family Medicine

## 2015-11-06 ENCOUNTER — Inpatient Hospital Stay: Payer: Medicare Other

## 2015-11-06 ENCOUNTER — Inpatient Hospital Stay
Admission: EM | Admit: 2015-11-06 | Discharge: 2015-11-07 | DRG: 189 | Disposition: A | Discharge status: Home / Self Care | Payer: Medicare Other | Attending: Internal Medicine | Admitting: Internal Medicine

## 2015-11-06 ENCOUNTER — Encounter: Payer: Self-pay | Admitting: Family Medicine

## 2015-11-06 ENCOUNTER — Emergency Department: Payer: Medicare Other

## 2015-11-06 ENCOUNTER — Encounter: Payer: Self-pay | Admitting: Emergency Medicine

## 2015-11-06 VITALS — BP 118/66 | HR 78 | Temp 97.7°F | Ht 71.0 in | Wt 170.0 lb

## 2015-11-06 DIAGNOSIS — N183 Chronic kidney disease, stage 3 (moderate): Secondary | ICD-10-CM | POA: Diagnosis present

## 2015-11-06 DIAGNOSIS — Z79899 Other long term (current) drug therapy: Secondary | ICD-10-CM | POA: Diagnosis not present

## 2015-11-06 DIAGNOSIS — Z23 Encounter for immunization: Secondary | ICD-10-CM | POA: Diagnosis not present

## 2015-11-06 DIAGNOSIS — J9601 Acute respiratory failure with hypoxia: Principal | ICD-10-CM | POA: Diagnosis present

## 2015-11-06 DIAGNOSIS — Z955 Presence of coronary angioplasty implant and graft: Secondary | ICD-10-CM | POA: Diagnosis not present

## 2015-11-06 DIAGNOSIS — R634 Abnormal weight loss: Secondary | ICD-10-CM | POA: Diagnosis present

## 2015-11-06 DIAGNOSIS — I2510 Atherosclerotic heart disease of native coronary artery without angina pectoris: Secondary | ICD-10-CM | POA: Diagnosis present

## 2015-11-06 DIAGNOSIS — I248 Other forms of acute ischemic heart disease: Secondary | ICD-10-CM | POA: Diagnosis present

## 2015-11-06 DIAGNOSIS — N179 Acute kidney failure, unspecified: Secondary | ICD-10-CM

## 2015-11-06 DIAGNOSIS — Z7982 Long term (current) use of aspirin: Secondary | ICD-10-CM | POA: Diagnosis not present

## 2015-11-06 DIAGNOSIS — M1990 Unspecified osteoarthritis, unspecified site: Secondary | ICD-10-CM | POA: Diagnosis present

## 2015-11-06 DIAGNOSIS — S2249XA Multiple fractures of ribs, unspecified side, initial encounter for closed fracture: Secondary | ICD-10-CM | POA: Diagnosis present

## 2015-11-06 DIAGNOSIS — R0602 Shortness of breath: Secondary | ICD-10-CM | POA: Diagnosis present

## 2015-11-06 DIAGNOSIS — J849 Interstitial pulmonary disease, unspecified: Secondary | ICD-10-CM | POA: Diagnosis present

## 2015-11-06 DIAGNOSIS — R0902 Hypoxemia: Secondary | ICD-10-CM

## 2015-11-06 DIAGNOSIS — Z96652 Presence of left artificial knee joint: Secondary | ICD-10-CM | POA: Diagnosis present

## 2015-11-06 DIAGNOSIS — Z6823 Body mass index (BMI) 23.0-23.9, adult: Secondary | ICD-10-CM

## 2015-11-06 DIAGNOSIS — E860 Dehydration: Secondary | ICD-10-CM | POA: Diagnosis present

## 2015-11-06 DIAGNOSIS — K219 Gastro-esophageal reflux disease without esophagitis: Secondary | ICD-10-CM | POA: Diagnosis present

## 2015-11-06 DIAGNOSIS — E785 Hyperlipidemia, unspecified: Secondary | ICD-10-CM | POA: Diagnosis present

## 2015-11-06 DIAGNOSIS — I129 Hypertensive chronic kidney disease with stage 1 through stage 4 chronic kidney disease, or unspecified chronic kidney disease: Secondary | ICD-10-CM | POA: Diagnosis present

## 2015-11-06 DIAGNOSIS — Z87891 Personal history of nicotine dependence: Secondary | ICD-10-CM

## 2015-11-06 DIAGNOSIS — R911 Solitary pulmonary nodule: Secondary | ICD-10-CM | POA: Diagnosis present

## 2015-11-06 DIAGNOSIS — J9600 Acute respiratory failure, unspecified whether with hypoxia or hypercapnia: Secondary | ICD-10-CM | POA: Diagnosis present

## 2015-11-06 LAB — URINALYSIS COMPLETE WITH MICROSCOPIC (ARMC ONLY)
Bacteria, UA: NONE SEEN
Bilirubin Urine: NEGATIVE
GLUCOSE, UA: NEGATIVE mg/dL
Hgb urine dipstick: NEGATIVE
KETONES UR: NEGATIVE mg/dL
Leukocytes, UA: NEGATIVE
NITRITE: NEGATIVE
Protein, ur: 100 mg/dL — AB
SPECIFIC GRAVITY, URINE: 1.023 (ref 1.005–1.030)
pH: 6 (ref 5.0–8.0)

## 2015-11-06 LAB — CBC
HCT: 36.7 % — ABNORMAL LOW (ref 40.0–52.0)
Hemoglobin: 11.9 g/dL — ABNORMAL LOW (ref 13.0–18.0)
MCH: 26.2 pg (ref 26.0–34.0)
MCHC: 32.4 g/dL (ref 32.0–36.0)
MCV: 80.9 fL (ref 80.0–100.0)
Platelets: 268 10*3/uL (ref 150–440)
RBC: 4.54 MIL/uL (ref 4.40–5.90)
RDW: 15.4 % — AB (ref 11.5–14.5)
WBC: 4.2 10*3/uL (ref 3.8–10.6)

## 2015-11-06 LAB — BASIC METABOLIC PANEL
ANION GAP: 10 (ref 5–15)
BUN: 63 mg/dL — AB (ref 6–20)
CALCIUM: 9.5 mg/dL (ref 8.9–10.3)
CO2: 25 mmol/L (ref 22–32)
CREATININE: 1.53 mg/dL — AB (ref 0.61–1.24)
Chloride: 105 mmol/L (ref 101–111)
GFR calc Af Amer: 45 mL/min — ABNORMAL LOW (ref >60)
GFR, EST NON AFRICAN AMERICAN: 39 mL/min — AB (ref >60)
GLUCOSE: 100 mg/dL — AB (ref 65–99)
Potassium: 4.3 mmol/L (ref 3.5–5.1)
Sodium: 140 mmol/L (ref 135–145)

## 2015-11-06 LAB — TSH: TSH: 4.126 u[IU]/mL (ref 0.350–4.500)

## 2015-11-06 LAB — TROPONIN I
TROPONIN I: 0.1 ng/mL — AB (ref <0.031)
Troponin I: 0.11 ng/mL — ABNORMAL HIGH (ref <0.031)
Troponin I: 0.1 ng/mL — ABNORMAL HIGH (ref <0.031)

## 2015-11-06 LAB — MRSA PCR SCREENING: MRSA BY PCR: NEGATIVE

## 2015-11-06 LAB — BRAIN NATRIURETIC PEPTIDE: B Natriuretic Peptide: 122 pg/mL — ABNORMAL HIGH (ref 0.0–100.0)

## 2015-11-06 MED ORDER — METOPROLOL SUCCINATE ER 50 MG PO TB24
50.0000 mg | ORAL_TABLET | Freq: Every day | ORAL | Status: DC
  Administered 2015-11-07: 50 mg via ORAL
  Filled 2015-11-06: qty 1

## 2015-11-06 MED ORDER — TRAMADOL HCL 50 MG PO TABS
50.0000 mg | ORAL_TABLET | Freq: Three times a day (TID) | ORAL | Status: DC | PRN
  Administered 2015-11-06: 50 mg via ORAL
  Filled 2015-11-06: qty 1

## 2015-11-06 MED ORDER — ENOXAPARIN SODIUM 40 MG/0.4ML ~~LOC~~ SOLN
40.0000 mg | SUBCUTANEOUS | Status: DC
Start: 2015-11-06 — End: 2015-11-07
  Administered 2015-11-06: 40 mg via SUBCUTANEOUS
  Filled 2015-11-06 (×2): qty 0.4

## 2015-11-06 MED ORDER — SELENIUM 200 MCG PO CAPS: 200.0000 ug | ORAL_CAPSULE | Freq: Every day | ORAL | Status: DC

## 2015-11-06 MED ORDER — OCUVITE-LUTEIN PO CAPS
1.0000 | ORAL_CAPSULE | Freq: Two times a day (BID) | ORAL | Status: DC
  Administered 2015-11-06 – 2015-11-07 (×2): 1 via ORAL
  Filled 2015-11-06 (×2): qty 1

## 2015-11-06 MED ORDER — ASPIRIN 81 MG PO CHEW
324.0000 mg | CHEWABLE_TABLET | Freq: Once | ORAL | Status: AC
  Administered 2015-11-06: 324 mg via ORAL
  Filled 2015-11-06: qty 4

## 2015-11-06 MED ORDER — SODIUM CHLORIDE 0.9 % IJ SOLN: 3.0000 mL | INTRAMUSCULAR | Status: DC | PRN

## 2015-11-06 MED ORDER — POLYETHYLENE GLYCOL 3350 17 G PO PACK
17.0000 g | PACK | Freq: Every day | ORAL | Status: DC
  Administered 2015-11-07: 17 g via ORAL
  Filled 2015-11-06: qty 1

## 2015-11-06 MED ORDER — ASPIRIN EC 81 MG PO TBEC
81.0000 mg | DELAYED_RELEASE_TABLET | Freq: Every day | ORAL | Status: DC
  Administered 2015-11-07: 81 mg via ORAL
  Filled 2015-11-06 (×2): qty 1

## 2015-11-06 MED ORDER — ACETAMINOPHEN 325 MG PO TABS: 650.0000 mg | ORAL_TABLET | Freq: Four times a day (QID) | ORAL | Status: DC | PRN

## 2015-11-06 MED ORDER — IPRATROPIUM-ALBUTEROL 0.5-2.5 (3) MG/3ML IN SOLN
3.0000 mL | Freq: Four times a day (QID) | RESPIRATORY_TRACT | Status: DC
  Administered 2015-11-06 – 2015-11-07 (×3): 3 mL via RESPIRATORY_TRACT
  Filled 2015-11-06 (×4): qty 3

## 2015-11-06 MED ORDER — SODIUM CHLORIDE 0.9 % IJ SOLN: 3.0000 mL | Freq: Two times a day (BID) | INTRAMUSCULAR | Status: DC

## 2015-11-06 MED ORDER — HYDRALAZINE HCL 20 MG/ML IJ SOLN
10.0000 mg | INTRAMUSCULAR | Status: DC | PRN
  Administered 2015-11-06: 10 mg via INTRAVENOUS
  Filled 2015-11-06: qty 1

## 2015-11-06 MED ORDER — MAGNESIUM OXIDE 400 (241.3 MG) MG PO TABS
400.0000 mg | ORAL_TABLET | Freq: Every day | ORAL | Status: DC
  Administered 2015-11-07: 400 mg via ORAL
  Filled 2015-11-06: qty 1

## 2015-11-06 MED ORDER — PANTOPRAZOLE SODIUM 40 MG PO TBEC
40.0000 mg | DELAYED_RELEASE_TABLET | Freq: Every day | ORAL | Status: DC
  Administered 2015-11-06 – 2015-11-07 (×2): 40 mg via ORAL
  Filled 2015-11-06 (×2): qty 1

## 2015-11-06 MED ORDER — SODIUM CHLORIDE 0.9 % IV BOLUS (SEPSIS)
1000.0000 mL | Freq: Once | INTRAVENOUS | Status: AC
  Administered 2015-11-06: 1000 mL via INTRAVENOUS

## 2015-11-06 MED ORDER — BUDESONIDE 0.25 MG/2ML IN SUSP
0.2500 mg | Freq: Two times a day (BID) | RESPIRATORY_TRACT | Status: DC
  Administered 2015-11-06 – 2015-11-07 (×2): 0.25 mg via RESPIRATORY_TRACT
  Filled 2015-11-06 (×2): qty 2

## 2015-11-06 MED ORDER — CALCIUM CARBONATE-VITAMIN D 500-200 MG-UNIT PO TABS
1.0000 | ORAL_TABLET | Freq: Two times a day (BID) | ORAL | Status: DC
  Administered 2015-11-06 – 2015-11-07 (×2): 1 via ORAL
  Filled 2015-11-06 (×2): qty 1

## 2015-11-06 MED ORDER — EZETIMIBE 10 MG PO TABS
10.0000 mg | ORAL_TABLET | Freq: Every day | ORAL | Status: DC
  Administered 2015-11-07: 10 mg via ORAL
  Filled 2015-11-06: qty 1

## 2015-11-06 MED ORDER — PNEUMOCOCCAL VAC POLYVALENT 25 MCG/0.5ML IJ INJ
0.5000 mL | INJECTION | INTRAMUSCULAR | Status: AC
  Administered 2015-11-07: 0.5 mL via INTRAMUSCULAR
  Filled 2015-11-06: qty 0.5

## 2015-11-06 MED ORDER — SODIUM CHLORIDE 0.9 % IV SOLN
INTRAVENOUS | Status: DC
  Administered 2015-11-06: 18:00:00 via INTRAVENOUS

## 2015-11-06 MED ORDER — ACETAMINOPHEN 650 MG RE SUPP: 650.0000 mg | Freq: Four times a day (QID) | RECTAL | Status: DC | PRN

## 2015-11-06 NOTE — ED Notes (Signed)
Patient fell the week of Thanksgiving and broke 4 ribs - 3 displaced - seen here the next day on 11/23.  Also had pneumonia at that time and placed on 10 days of Levaquin. Managing okay per daughter until 2 weeks ago when he began having weakness, no appetite, 10# weight loss in 3 weeks.  Patient seen this AM in [place] office -sent to ED with pulse ox 78.

## 2015-11-06 NOTE — ED Notes (Signed)
Patient to subwait via w/c with daughter.

## 2015-11-06 NOTE — Progress Notes (Signed)
Pre visit review using our clinic review tool, if applicable. No additional management support is needed unless otherwise documented below in the visit note. 

## 2015-11-06 NOTE — Progress Notes (Signed)
PHARMACIST - PHYSICIAN ORDER COMMUNICATION  CONCERNING: P&T Medication Policy on Herbal Medications  DESCRIPTION:  This patient's order for:  Selenium  has been noted.  This product(s) is classified as an "herbal" or natural product. Due to a lack of definitive safety studies or FDA approval, nonstandard manufacturing practices, plus the potential risk of unknown drug-drug interactions while on inpatient medications, the Pharmacy and Therapeutics Committee does not permit the use of "herbal" or natural products of this type within [facility].   ACTION TAKEN: The pharmacy department is unable to verify this order at this time and your patient has been informed of this safety policy. Please reevaluate patient's clinical condition at discharge and address if the herbal or natural product(s) should be resumed at that time.   

## 2015-11-06 NOTE — Progress Notes (Signed)
SATURATION QUALIFICATIONS: (This note is used to comply with regulatory documentation for home oxygen)  Patient Saturations on Room Air at Rest = 87%  Patient Saturations on Room Air while Ambulating = %  Patient Saturations on 2 Liters of oxygen while at rest = 93%  Please briefly explain why patient needs home oxygen: 

## 2015-11-06 NOTE — H&P (Signed)
[facility] - [place] at [facility]   PATIENT NAME: Luis Rowe    MR#:  [id number]  DATE OF BIRTH:  Sep 30, 1926  DATE OF ADMISSION:  11/06/2015  PRIMARY CARE PHYSICIAN: Marikay Alar, MD   REQUESTING/REFERRING PHYSICIAN: Dr. Langston Masker  CHIEF COMPLAINT:   Chief Complaint  Patient presents with  . Weakness    HISTORY OF PRESENT ILLNESS:  Luis Rowe  is a 82 y.o. male presenting with loss of energy and no appetite and some weight loss of 10 pounds over the last 3 weeks. He states he has been trying to drink 2 boosts per day. He eats a piece of toast and drinks coffee and apple juice for breakfast. He goes down for 1 meal per day. He complains of shortness of breath with exertion. He tries to walk with his walker. He was seen by his medical doctor today and sent into the ER for further evaluation for his pulse ox being 73% on room air. As per PMD note, the patient does have a history of interstitial lung disease.  PAST MEDICAL HISTORY:   Past Medical History  Diagnosis Date  . Hyperlipidemia   . Hypertension   . Coronary artery disease   . Arthritis   . Anemia, iron deficiency 05/17/2012    PAST SURGICAL HISTORY:   Past Surgical History  Procedure Laterality Date  . Other surgical history      tonsilectomy  . Circumcision  1968  . Cardiac catheterization  05/05/2000    stents  . Cardiac catheterization  01/13/2000    stents and ptca  . Joint replacement Left     knee  . Tonsillectomy  1935    SOCIAL HISTORY:   Social History  Substance Use Topics  . Smoking status: Former Smoker    Types: Cigarettes  . Smokeless tobacco: Not on file  . Alcohol Use: No    FAMILY HISTORY:   Family History  Problem Relation Age of Onset  . Breast cancer Mother   . Cancer Mother   . Heart disease Father   . Breast cancer Sister   . Cancer Sister     DRUG ALLERGIES:   Allergies  Allergen Reactions  . Crestor [Rosuvastatin Calcium]    myalgias  . Lipitor [Atorvastatin Calcium]     myalgias  . Zocor [Simvastatin - High Dose]     myalgias    REVIEW OF SYSTEMS:  CONSTITUTIONAL: No fever, positive for fatigue and generalized weakness. Positive weight loss 10 pounds 3 weeks EYES: No blurred or double vision. Wears glasses EARS, NOSE, AND THROAT: No tinnitus or ear pain. No sore throat RESPIRATORY: No cough, positive for shortness of breath with exertion, no wheezing or hemoptysis.  CARDIOVASCULAR: No chest pain, edema.  GASTROINTESTINAL: No nausea, vomiting, diarrhea or abdominal pain. No blood in bowel movements.  GENITOURINARY: No dysuria, hematuria.  ENDOCRINE: No polyuria, nocturia,  HEMATOLOGY: No anemia, easy bruising or bleeding SKIN: No rash or lesion. MUSCULOSKELETAL: Positive for arthritis.   NEUROLOGIC: No tingling, numbness, weakness.  PSYCHIATRY: No anxiety or depression.   MEDICATIONS AT HOME:   Prior to Admission medications   Medication Sig Start Date End Date Taking? Authorizing Provider  aspirin EC 81 MG tablet Take 81 mg by mouth daily.   Yes Historical Provider, MD  Calcium Carbonate-Vitamin D (CALCIUM-VITAMIN D) 500-200 MG-UNIT per tablet Take 1 tablet by mouth 2 (two) times daily.    Yes Historical Provider, MD  Doxylamine Succinate, Sleep, (SLEEP AID PO) Take  1 tablet by mouth at bedtime. 1/2 daily   Yes Historical Provider, MD  ezetimibe (ZETIA) 10 MG tablet Take 1 tablet (10 mg total) by mouth daily. 08/07/15  Yes Cassell Clement, MD  fenofibrate (TRICOR) 145 MG tablet Take 145 mg by mouth daily.   Yes Historical Provider, MD  ibuprofen (ADVIL,MOTRIN) 600 MG tablet Take 1 tablet (600 mg total) by mouth every 8 (eight) hours as needed. 09/12/15  Yes Jennye Moccasin, MD  Magnesium 400 MG CAPS Take 400 mg by mouth daily.    Yes Historical Provider, MD  metoprolol succinate (TOPROL-XL) 50 MG 24 hr tablet Take 50 mg by mouth daily.    Yes Historical Provider, MD  Multiple Vitamins-Minerals  (PRESERVISION AREDS 2 PO) Take 1 tablet by mouth 2 (two) times daily.    Yes Historical Provider, MD  Naproxen Sodium (ALEVE) 220 MG CAPS Take 1 capsule by mouth daily.    Yes Historical Provider, MD  Omega-3 Fatty Acids (FISH OIL) 1000 MG CAPS Take 1,000 mg by mouth 2 (two) times daily. Reported on 10/10/2015   Yes Historical Provider, MD  omeprazole (PRILOSEC) 20 MG capsule Take 20 mg by mouth daily.     Yes Historical Provider, MD  polyethylene glycol (MIRALAX / GLYCOLAX) packet Take 17 g by mouth daily.    Yes Historical Provider, MD  Selenium 200 MCG CAPS Take 200 mcg by mouth daily.    Yes Historical Provider, MD  traMADol (ULTRAM) 50 MG tablet Take 1 tablet (50 mg total) by mouth every 8 (eight) hours as needed for moderate pain. 10/10/15  Yes Glori Luis, MD      VITAL SIGNS:  Blood pressure 143/62, pulse 56, temperature 97.8 F (36.6 C), temperature source Oral, resp. rate 14, height  (1.803 m), weight 77.111 kg (170 lb), SpO2 100 %.  PHYSICAL EXAMINATION:  GENERAL:  82 y.o.-year-old patient lying in the bed with no acute distress.  EYES: Pupils equal, round, reactive to light and accommodation. No scleral icterus. Extraocular muscles intact.  HEENT: Head atraumatic, normocephalic. Oropharynx and nasopharynx clear.  NECK:  Supple, no jugular venous distention. No thyroid enlargement, no tenderness.  LUNGS: Decreased and coarse breath sounds bilaterally, no wheezing, rales,rhonchi or crepitation. No use of accessory muscles of respiration.  CARDIOVASCULAR: S1, S2 normal. No murmurs, rubs, or gallops.  ABDOMEN: Soft, nontender, nondistended. Bowel sounds present. No organomegaly or mass.  EXTREMITIES: Trace edema, no cyanosis, or clubbing.  NEUROLOGIC: Cranial nerves II through XII are intact. Muscle strength 5/5 in all extremities. Sensation intact. Gait not checked.  PSYCHIATRIC: The patient is alert and oriented x 3.  SKIN: No rash, lesion, or ulcer.   LABORATORY  PANEL:   CBC  Recent Labs Lab 11/06/15 1040  WBC 4.2  HGB 11.9*  HCT 36.7*  PLT 268   ------------------------------------------------------------------------------------------------------------------  Chemistries   Recent Labs Lab 11/06/15 1040  NA 140  K 4.3  CL 105  CO2 25  GLUCOSE 100*  BUN 63*  CREATININE 1.53*  CALCIUM 9.5   ------------------------------------------------------------------------------------------------------------------  Cardiac Enzymes  Recent Labs Lab 11/06/15 1040  TROPONINI 0.10*   ------------------------------------------------------------------------------------------------------------------  RADIOLOGY:  Dg Chest 2 View  11/06/2015  CLINICAL DATA:  Shortness of breath, quit smoking 35 years ago, denies chest pain EXAM: CHEST  2 VIEW COMPARISON:  09/25/2015 FINDINGS: There is bilateral chronic interstitial lung disease. There is no focal parenchymal opacity. There is no pleural effusion or pneumothorax. The heart and mediastinal contours are unremarkable. There is  an ununited right posterior eighth rib fracture. There is a healing right posterolateral seventh rib fracture. IMPRESSION: No active cardiopulmonary disease. Electronically Signed   By: Elige Ko   On: 11/06/2015 11:49    EKG:   Sinus rhythm first degree AV block, PVC, Q waves septally.  IMPRESSION AND PLAN:   1. Acute respiratory failure with hypoxia. Chest x-ray is negative. As per PMD note, the patient has a history of interstitial lung disease. I do not have any imaging to back this up. I will get a CT scan of the chest without contrast since the patient does have chronic kidney disease. Oxygen supplementation. May need home oxygen. I will give nebulizer treatments. 2. Weight loss and lack of energy- check a TSH. Hypoxia could do that if he's walking around. We'll check a pulse ox on room air in a.m. Guaiac stools. DC fenofibrate. 3. Elevated troponin- likely demand  ischemia from acute respiratory failure. Monitor troponins and continue aspirin. Could also be false positive from chronic kidney disease. 4. Chronic kidney disease stage III with slight dehydration on labs- gentle IV fluid hydration. 5. Hyperlipidemia unspecified continue Zetia. DC fenofibrate since that could also cause lack of energy 6. GERD on PPI 7. Previous rib fractures on chest x-ray 8. Weakness physical therapy evaluation  All the records are reviewed and case discussed with ED provider. Management plans discussed with the patient, family and they are in agreement.  CODE STATUS: Full code  TOTAL TIME TAKING CARE OF THIS PATIENT: 50 minutes.    Alford Highland M.D on 11/06/2015 at 1:44 PM  Between 7am to 6pm - Pager - [phone]  After 6pm call admission pager [phone]  [facility]  Office  [phone]  CC: Primary care physician; Marikay Alar, MD

## 2015-11-06 NOTE — ED Notes (Signed)
Patient transported to X-ray 

## 2015-11-06 NOTE — ED Provider Notes (Signed)
[facility] Emergency Department Provider Note  ____________________________________________  Time seen: Approximately 11:15 AM  I have reviewed the triage vital signs and the nursing notes.   HISTORY  Chief Complaint Weakness    HPI Luis Rowe is a 82 y.o. male with a history of coronary artery disease and hyperlipidemia who is presenting today with weakness and weight loss over the past 3 weeks. The patient denies any pain, nausea vomiting. He does say that he has some shortness of breath with exertion but does not have a cough or fever or any sputum production. He was found to be hypoxic today at low [place] neck and sent to the emergency department for further evaluation. He did have rib fractures in November and was subsequently treated for pneumonia but he says these issues have resolved.   Past Medical History  Diagnosis Date  . Hyperlipidemia   . Hypertension   . Coronary artery disease   . Arthritis   . Anemia, iron deficiency 05/17/2012    Patient Active Problem List   Diagnosis Date Noted  . Shortness of breath 11/06/2015  . Macular degeneration 10/10/2015  . Multiple rib fractures 09/25/2015  . CAD (coronary artery disease) 08/28/2015  . Anemia, iron deficiency 05/17/2012  . Leukopenia 05/10/2012  . Arthritis   . Benign hypertensive heart disease without heart failure 05/07/2011  . Hypercholesterolemia 05/07/2011  . Ischemic heart disease 05/07/2011  . BPH (benign prostatic hyperplasia) 05/07/2011    Past Surgical History  Procedure Laterality Date  . Other surgical history      tonsilectomy  . Circumcision  1968  . Cardiac catheterization  05/05/2000    stents  . Cardiac catheterization  01/13/2000    stents and ptca  . Joint replacement Left     knee  . Tonsillectomy  1935    Current Outpatient Rx  Name  Route  Sig  Dispense  Refill  . aspirin EC 81 MG tablet   Oral   Take 81 mg by mouth daily.         . Calcium  Carbonate-Vitamin D (CALCIUM-VITAMIN D) 500-200 MG-UNIT per tablet   Oral   Take 1 tablet by mouth 2 (two) times daily.          . Doxylamine Succinate, Sleep, (SLEEP AID PO)   Oral   Take 1 tablet by mouth at bedtime. 1/2 daily         . ezetimibe (ZETIA) 10 MG tablet   Oral   Take 1 tablet (10 mg total) by mouth daily.   90 tablet   1   . fenofibrate (TRICOR) 145 MG tablet   Oral   Take 145 mg by mouth daily.         . fenofibrate 160 MG tablet   Oral   Take 1 tablet (160 mg total) by mouth daily. Patient not taking: Reported on 11/06/2015   90 tablet   3   . ibuprofen (ADVIL,MOTRIN) 600 MG tablet   Oral   Take 1 tablet (600 mg total) by mouth every 8 (eight) hours as needed.   30 tablet   1   . Magnesium 400 MG CAPS   Oral   Take 400 mg by mouth daily.          . metoprolol succinate (TOPROL-XL) 50 MG 24 hr tablet   Oral   Take 50 mg by mouth daily.          . Multiple Vitamins-Minerals (PRESERVISION AREDS 2  PO)   Oral   Take 1 tablet by mouth 2 (two) times daily.          . Naproxen Sodium (ALEVE) 220 MG CAPS   Oral   Take 1 capsule by mouth daily.          . Omega-3 Fatty Acids (FISH OIL) 1000 MG CAPS   Oral   Take 1,000 mg by mouth 2 (two) times daily. Reported on 10/10/2015         . omeprazole (PRILOSEC) 20 MG capsule   Oral   Take 20 mg by mouth daily.           . polyethylene glycol (MIRALAX / GLYCOLAX) packet   Oral   Take 17 g by mouth daily.          . Selenium 200 MCG CAPS   Oral   Take 200 mcg by mouth daily.          . traMADol (ULTRAM) 50 MG tablet   Oral   Take 1 tablet (50 mg total) by mouth every 8 (eight) hours as needed for moderate pain.   180 tablet   1     Allergies Crestor; Lipitor; and Zocor  Family History  Problem Relation Age of Onset  . Breast cancer Mother   . Cancer Mother   . Heart disease Father   . Breast cancer Sister   . Cancer Sister     Social History Social History   Substance Use Topics  . Smoking status: Former Smoker    Types: Cigarettes  . Smokeless tobacco: None  . Alcohol Use: No    Review of Systems Constitutional: No fever/chills Eyes: No visual changes. ENT: No sore throat. Cardiovascular: Denies chest pain. Respiratory: As above Gastrointestinal: No abdominal pain.  No nausea, no vomiting.  No diarrhea.  No constipation. Genitourinary: Negative for dysuria. Musculoskeletal: Negative for back pain. Skin: Negative for rash. Neurological: Negative for headaches, focal weakness or numbness.  10-point ROS otherwise negative.  ____________________________________________   PHYSICAL EXAM:  VITAL SIGNS: ED Triage Vitals  Enc Vitals Group     BP 11/06/15 1025 126/55 mmHg     Pulse Rate 11/06/15 1025 70     Resp 11/06/15 1025 16     Temp 11/06/15 1025 97.8 F (36.6 C)     Temp Source 11/06/15 1025 Oral     SpO2 11/06/15 1025 91 %     Weight 11/06/15 1025 170 lb (77.111 kg)     Height 11/06/15 1025  (1.803 m)     Head Cir --      Peak Flow --      Pain Score 11/06/15 1107 0     Pain Loc --      Pain Edu? --      Excl. in GC? --     Constitutional: Alert and oriented. Well appearing and in no acute distress. Eyes: Conjunctivae are normal. PERRL. EOMI. Head: Atraumatic. Nose: No congestion/rhinnorhea. Mouth/Throat: Mucous membranes are moist.  Oropharynx non-erythematous. Neck: No stridor.   Cardiovascular: Normal rate, regular rhythm. Grossly normal heart sounds.  Good peripheral circulation. Respiratory: Normal respiratory effort.  No retractions. Rales to the bilateral lung fields which are worse on the right lower field. Gastrointestinal: Soft and nontender. No distention. No abdominal bruits. No CVA tenderness. Musculoskeletal: No lower extremity tenderness nor edema.  No joint effusions. Neurologic:  Normal speech and language. No gross focal neurologic deficits are appreciated. No gait instability. Skin:  Skin  is warm, dry and intact. No rash noted. Psychiatric: Mood and affect are normal. Speech and behavior are normal.  ____________________________________________   LABS (all labs ordered are listed, but only abnormal results are displayed)  Labs Reviewed  BASIC METABOLIC PANEL - Abnormal; Notable for the following:    Glucose, Bld 100 (*)    BUN 63 (*)    Creatinine, Ser 1.53 (*)    GFR calc non Af Amer 39 (*)    GFR calc Af Amer 45 (*)    All other components within normal limits  CBC - Abnormal; Notable for the following:    Hemoglobin 11.9 (*)    HCT 36.7 (*)    RDW 15.4 (*)    All other components within normal limits  URINALYSIS COMPLETEWITH MICROSCOPIC ([facility] ONLY)  CBG MONITORING, ED   ____________________________________________  EKG  ED ECG REPORT I, Arelia Longest, the attending physician, personally viewed and interpreted this ECG.   Date: 11/06/2015  EKG Time: 1025  Rate: 75  Rhythm: Sinus rhythm with first-degree AV block  Axis: Normal axis  Intervals: Normal intervals  ST&T Change: No ST segment elevation or depression. T-wave inversion in aVL.  EKG unchanged from that from 08/28/2015. ____________________________________________  RADIOLOGY  No acute cardiopulmonary findings on the chest x-ray. ____________________________________________   PROCEDURES    ____________________________________________   INITIAL IMPRESSION / ASSESSMENT AND PLAN / ED COURSE  Pertinent labs & imaging results that were available during my care of the patient were reviewed by me and considered in my medical decision making (see chart for details).  ----------------------------------------- 1:08 PM on 11/06/2015 -----------------------------------------  Patient resting comfortably at this time he continues to sat well on his nasal cannula oxygen. Found to be dehydrated with prerenal findings secondary to BUN/creatinine ratio. Also with a troponin of 0.10 which  may be related to coronary strain or from his renal failure. Patient understands that he will need to be admitted to the hospital. Signed out to Dr.Weiting.  Patient receiving aspirin as well as IV fluids. ____________________________________________   FINAL CLINICAL IMPRESSION(S) / ED DIAGNOSES  Hypoxia. Acute renal failure.    Myrna Blazer, MD 11/06/15 1309

## 2015-11-06 NOTE — Progress Notes (Signed)
Patient ID: Luis Rowe, male   DOB: Jul 29, 1926, 82 y.o.   MRN: [id number]  Luis Alar, MD Phone: [phone]  Luis Rowe is a 82 y.o. male who presents today for same-day visit.  Patient notes for the last several weeks he has had increasing fatigue and decreasing appetite. He notes he gets short of breath with activity. No chest pain. Notes his strength is not as good as normal. Also notes dry mouth. No fevers, sweats, nausea, vomiting, diarrhea, abdominal pain, blood in stool, dysuria, or cough. Does note mild phlegm in the back of his throat. He does have a history of CAD and stent placement. He notes his rib fractures are quite a bit better. He is aware the rib fractures are there though no bruising or pain at this time. No pain with deep breaths.  PMH: nonsmoker.   ROS see history of present illness  Objective  Physical Exam Filed Vitals:   11/06/15 0903  BP: 118/66  Pulse: 78  Temp: 97.7 F (36.5 C)   nursing noted oxygen was 78% on arriving in the room and came up to 91% with rest Ambulatory O2 sat 78%  BP Readings from Last 3 Encounters:  11/06/15 118/66  10/10/15 144/78  09/25/15 122/60   Wt Readings from Last 3 Encounters:  11/06/15 170 lb (77.111 kg)  10/10/15 179 lb 9.6 oz (81.466 kg)  09/25/15 183 lb (83.008 kg)    Physical Exam  Constitutional: No distress.  HENT:  Head: Normocephalic and atraumatic.  Right Ear: External ear normal.  Left Ear: External ear normal.  Mouth/Throat: Oropharynx is clear and moist. No oropharyngeal exudate.  Eyes: Conjunctivae are normal. Pupils are equal, round, and reactive to light.  Neck: Neck supple.  Cardiovascular: Normal rate, regular rhythm and normal heart sounds.  Exam reveals no gallop and no friction rub.   No murmur heard. Pulmonary/Chest: Effort normal and breath sounds normal. He has no wheezes. He has no rales.  No respiratory distress, though patient does appear fatigued with just moving around the  room, minimal bibasilar crackles, no rib tenderness in the area of his prior fractures, no bruising in the right flank  Abdominal: Soft. Bowel sounds are normal. He exhibits no distension. There is no tenderness. There is no rebound and no guarding.  Musculoskeletal: He exhibits no edema.  Lymphadenopathy:    He has no cervical adenopathy.  Neurological: He is alert.  Gait slow  Skin: Skin is warm and dry. He is not diaphoretic.   EKG: Sinus rhythm, first-degree AV block, rate 73, no ST or T-wave changes  Assessment/Plan: Please see individual problem list.  Shortness of breath Patient notes intermittent shortness of breath and fatigue for the last several weeks. No focal findings on exam. EKG is reassuring. He was hypoxic on arrival to the room and hypoxic on ambulation. Concern would be for cardiac cause. Could be pulmonary cause, though lung exam is stable from previously. Patient does have known interstitial lung disease. Doubt congestive heart failure given lack of history and no edema. Unlikely PE given stable vital signs. Could be anemia related. Regardless patient needs evaluation in the emergency room for this given his oxygen desaturation. He was advised EMS transport, though declined this and opted to ride with his daughter. AMA paperwork was signed. CMA called the ED and advised the patient was on his way.    Orders Placed This Encounter  Procedures  . EKG 12-Lead     Luis Rowe

## 2015-11-06 NOTE — Progress Notes (Signed)
Skin assessment completed with Tammy RN. Trudee Kuster

## 2015-11-06 NOTE — Assessment & Plan Note (Addendum)
Patient notes intermittent shortness of breath and fatigue for the last several weeks. No focal findings on exam. EKG is reassuring. He was hypoxic on arrival to the room and hypoxic on ambulation. Concern would be for cardiac cause. Could be pulmonary cause, though lung exam is stable from previously. Patient does have known interstitial lung disease. Doubt congestive heart failure given lack of history and no edema. Unlikely PE given stable vital signs. Could be anemia related. Regardless patient needs evaluation in the emergency room for this given his oxygen desaturation. He was advised EMS transport, though declined this and opted to ride with his daughter. AMA paperwork was signed. CMA called the ED and advised the patient was on his way.

## 2015-11-06 NOTE — Patient Instructions (Signed)
Nice to see you. Please go to the emergency room for evaluation immediately.

## 2015-11-07 ENCOUNTER — Telehealth: Payer: Self-pay | Admitting: Family Medicine

## 2015-11-07 DIAGNOSIS — J9601 Acute respiratory failure with hypoxia: Secondary | ICD-10-CM | POA: Diagnosis not present

## 2015-11-07 DIAGNOSIS — J849 Interstitial pulmonary disease, unspecified: Secondary | ICD-10-CM | POA: Diagnosis present

## 2015-11-07 DIAGNOSIS — S2249XA Multiple fractures of ribs, unspecified side, initial encounter for closed fracture: Secondary | ICD-10-CM | POA: Diagnosis present

## 2015-11-07 DIAGNOSIS — R911 Solitary pulmonary nodule: Secondary | ICD-10-CM | POA: Diagnosis present

## 2015-11-07 LAB — CBC
HCT: 35 % — ABNORMAL LOW (ref 40.0–52.0)
HEMOGLOBIN: 11.3 g/dL — AB (ref 13.0–18.0)
MCH: 26.5 pg (ref 26.0–34.0)
MCHC: 32.3 g/dL (ref 32.0–36.0)
MCV: 82 fL (ref 80.0–100.0)
PLATELETS: 246 10*3/uL (ref 150–440)
RBC: 4.26 MIL/uL — AB (ref 4.40–5.90)
RDW: 15.3 % — ABNORMAL HIGH (ref 11.5–14.5)
WBC: 4 10*3/uL (ref 3.8–10.6)

## 2015-11-07 LAB — BASIC METABOLIC PANEL
ANION GAP: 6 (ref 5–15)
BUN: 51 mg/dL — ABNORMAL HIGH (ref 6–20)
CHLORIDE: 109 mmol/L (ref 101–111)
CO2: 25 mmol/L (ref 22–32)
CREATININE: 1.36 mg/dL — AB (ref 0.61–1.24)
Calcium: 9.3 mg/dL (ref 8.9–10.3)
GFR calc non Af Amer: 45 mL/min — ABNORMAL LOW (ref >60)
GFR, EST AFRICAN AMERICAN: 52 mL/min — AB (ref >60)
Glucose, Bld: 119 mg/dL — ABNORMAL HIGH (ref 65–99)
POTASSIUM: 4.4 mmol/L (ref 3.5–5.1)
SODIUM: 140 mmol/L (ref 135–145)

## 2015-11-07 LAB — LIPID PANEL
CHOL/HDL RATIO: 6.4 ratio
CHOLESTEROL: 140 mg/dL (ref 0–200)
HDL: 22 mg/dL — ABNORMAL LOW (ref >40)
LDL Cholesterol: 91 mg/dL (ref 0–99)
Triglycerides: 133 mg/dL (ref <150)
VLDL: 27 mg/dL (ref 0–40)

## 2015-11-07 LAB — VITAMIN B12: VITAMIN B 12: 237 pg/mL (ref 180–914)

## 2015-11-07 MED ORDER — HYDROCODONE-ACETAMINOPHEN 5-325 MG PO TABS
0.5000 | ORAL_TABLET | Freq: Three times a day (TID) | ORAL | Status: AC | PRN
Start: 2015-11-07 — End: ?

## 2015-11-07 MED ORDER — ALBUTEROL SULFATE HFA 108 (90 BASE) MCG/ACT IN AERS: 2.0000 | INHALATION_SPRAY | Freq: Four times a day (QID) | RESPIRATORY_TRACT | Status: AC | PRN

## 2015-11-07 NOTE — Evaluation (Signed)
Physical Therapy Evaluation Patient Details Name: Luis Rowe MRN: [id number] DOB: [birth year] Today's Date: 11/07/2015   History of Present Illness  Pt here with acture respiratory failure, sats were in the low 70s on arrival.  Pt has lost 10 pounds recently and apparently has not been eating and drinking enough recently.    Clinical Impression  Pt is able to do bed mobility and transfers with no direct assist and ambulates safely with walker and no LOBs and slow but consistent speed.  He is on 2 liters of O2 and at rest was in the low 90s and essentially stayed there t/o most of ambulation but by the end he had dropped to 87% and was feeling quite fatigued.  Pt may need O2 on discharge?  Pt not interested in rehab but is willing to work with HHPT coming to his apartment.     Follow Up Recommendations Home health PT (discussed with pt/daughter, he does not want rehab)    Equipment Recommendations   (may need O2)    Recommendations for Other Services       Precautions / Restrictions Precautions Precautions: Fall Restrictions Weight Bearing Restrictions: No      Mobility  Bed Mobility Overal bed mobility: Modified Independent                Transfers Overall transfer level: Modified independent Equipment used: Rolling walker (2 wheeled)             General transfer comment: Pt is able to rise to standing w/o direct assist and maintains balance with only minimal UE use of walker.   Ambulation/Gait Ambulation/Gait assistance: Min guard Ambulation Distance (Feet): 100 Feet Assistive device: Rolling walker (2 wheeled)     Gait velocity interpretation: <1.8 ft/sec, indicative of risk for recurrent falls General Gait Details: Pt with slow, but safe ambulation.  He reports that he feels weaker than his baseline but he does not appear to have any true safety concerns.  He is on 2 liters t/o the effort and most of the time he was in the low 90s but did drop to 87% by the  end.  Pt was tired with the effort and likely could not do much more.    Stairs            Wheelchair Mobility    Modified Rankin (Stroke Patients Only)       Balance Overall balance assessment: Modified Independent                               Standardized Balance Assessment Standardized Balance Assessment :  (able to maintain eyes closed with moderate perturbations )           Pertinent Vitals/Pain      Home Living Family/patient expects to be discharged to::  (independent living at the [facility] at [place])                      Prior Function Level of Independence: Independent with assistive device(s)         Comments: Pt had been walking ~500 ft (?) for meals but has not done this in 1-2 months.  He is able to get around his apartment w/o issue, though he did have a fall around Thanksgiving.       Hand Dominance        Extremity/Trunk Assessment   Upper Extremity Assessment: Generalized weakness (age appropriate  limitations with shoulder elevation to 80)           Lower Extremity Assessment: Overall WFL for tasks assessed (unable to achieve TKE b/l, but generally functional strength)         Communication   Communication: No difficulties  Cognition Arousal/Alertness: Awake/alert Behavior During Therapy: WFL for tasks assessed/performed Overall Cognitive Status: Within Functional Limits for tasks assessed                      General Comments      Exercises        Assessment/Plan    PT Assessment Patient needs continued PT services  PT Diagnosis Difficulty walking   PT Problem List Decreased strength;Decreased activity tolerance;Decreased range of motion;Decreased balance;Decreased mobility;Decreased safety awareness  PT Treatment Interventions DME instruction;Gait training;Functional mobility training;Stair training;Therapeutic activities;Therapeutic exercise;Balance training;Neuromuscular re-education    PT Goals (Current goals can be found in the Care Plan section) Acute Rehab PT Goals Patient Stated Goal: Pt reports he knows he needs to get stronger PT Goal Formulation: With patient/family Time For Goal Achievement: 11/21/15 Potential to Achieve Goals: Fair    Frequency Min 2X/week   Barriers to discharge        Co-evaluation               End of Session Equipment Utilized During Treatment: Gait belt Activity Tolerance: Patient limited by fatigue Patient left: with chair alarm set;with family/visitor present;with call bell/phone within reach           Time: 0943-1003 PT Time Calculation (min) (ACUTE ONLY): 20 min   Charges:   PT Evaluation $PT Eval Low Complexity: 1 Procedure     PT G Codes:       Loran Senters, PT, DPT [phone]  Malachi Pro 11/07/2015, 11:06 AM

## 2015-11-07 NOTE — Telephone Encounter (Signed)
Noted  

## 2015-11-07 NOTE — Discharge Instructions (Signed)
°  DIET:  Regular diet  DISCHARGE CONDITION:  Stable  ACTIVITY:  Activity as tolerated  OXYGEN:  Home Oxygen: Yes.     Oxygen Delivery: 2 liters/min via Patient connected to nasal cannula oxygen  DISCHARGE LOCATION:  home   If you experience worsening of your admission symptoms, develop shortness of breath, life threatening emergency, suicidal or homicidal thoughts you must seek medical attention immediately by calling 911 or calling your MD immediately  if symptoms less severe.  You Must read complete instructions/literature along with all the possible adverse reactions/side effects for all the Medicines you take and that have been prescribed to you. Take any new Medicines after you have completely understood and accpet all the possible adverse reactions/side effects.   Please note  You were cared for by a hospitalist during your hospital stay. If you have any questions about your discharge medications or the care you received while you were in the hospital after you are discharged, you can call the unit and asked to speak with the hospitalist on call if the hospitalist that took care of you is not available. Once you are discharged, your primary care physician will handle any further medical issues. Please note that NO REFILLS for any discharge medications will be authorized once you are discharged, as it is imperative that you return to your primary care physician (or establish a relationship with a primary care physician if you do not have one) for your aftercare needs so that they can reassess your need for medications and monitor your lab values.

## 2015-11-07 NOTE — Telephone Encounter (Signed)
HFU, FYI,Pt is being discharged from the hospital today. I scheduled pt for 1/25 @ 11am. Thank You!

## 2015-11-07 NOTE — Care Management Note (Signed)
Case Management Note  Patient Details  Name: Luis Rowe MRN: [id number] Date of Birth: 1926/10/12  Subjective/Objective:   MD added HHA at daughters request. Barbara Cower with [facility] notified.               Action/Plan:   Expected Discharge Date:                  Expected Discharge Plan:  Home w Home Health Services  In-House Referral:     Discharge planning Services  CM Consult  Post Acute Care Choice:  Durable Medical Equipment, Home Health Choice offered to:  Adult Children, Patient  DME Arranged:  Oxygen DME Agency:  [facility]  HH Arranged:  PT HH Agency:  [facility]  Status of Service:  Completed, signed off  Medicare Important Message Given:    Date Medicare IM Given:    Medicare IM give by:    Date Additional Medicare IM Given:    Additional Medicare Important Message give by:     If discussed at Long Length of Stay Meetings, dates discussed:    Additional Comments:  Marily Memos, RN 11/07/2015, 2:35 PM

## 2015-11-07 NOTE — Care Management Note (Signed)
Case Management Note  Patient Details  Name: Luis Rowe MRN: [id number] Date of Birth: 22-Aug-1926  Subjective/Objective:  RNCM assessment for discharge planning. Spoke with patient and he is agreeable to home health PT and home O2. Spoke with daughter, Mrs. Mollett who assists with patients care at the [facility] [place]. She has no agency preference. Referral to [facility] for O2 and PT.Patient uses a walker. Hx of 1 fall in Nov. 2016 per patient. He is independent in his apartment otherwise. Daughter and patient deny any additional concerns with medications, copays or medical care. Spoke with Azalia Bilis, nurse at [facility] and updated her on POC.                  Action/Plan:   Expected Discharge Date:                  Expected Discharge Plan:  Home w Home Health Services  In-House Referral:     Discharge planning Services  CM Consult  Post Acute Care Choice:  Durable Medical Equipment, Home Health Choice offered to:  Adult Children, Patient  DME Arranged:  Oxygen DME Agency:  [facility]  HH Arranged:  PT HH Agency:  [facility]  Status of Service:  Completed, signed off  Medicare Important Message Given:    Date Medicare IM Given:    Medicare IM give by:    Date Additional Medicare IM Given:    Additional Medicare Important Message give by:     If discussed at Long Length of Stay Meetings, dates discussed:    Additional Comments:  Marily Memos, RN 11/07/2015, 11:23 AM

## 2015-11-08 ENCOUNTER — Telehealth: Payer: Self-pay

## 2015-11-08 LAB — CALCITRIOL (1,25 DI-OH VIT D): Vit D, 1,25-Dihydroxy: 49.5 pg/mL (ref 19.9–79.3)

## 2015-11-08 NOTE — Discharge Summary (Signed)
[facility] [place] [facility]   PATIENT NAME: Luis Rowe    MR#:  [id number]  DATE OF BIRTH:  1926/02/14  DATE OF ADMISSION:  11/06/2015 ADMITTING PHYSICIAN: Alford Highland, MD  DATE OF DISCHARGE: 11/07/2015  2:39 PM  PRIMARY CARE PHYSICIAN: Marikay Alar, MD    ADMISSION DIAGNOSIS:  Shortness of breath [R06.02] Hypoxic [R09.02] Acute renal failure, unspecified acute renal failure type (HCC) [N17.9]  DISCHARGE DIAGNOSIS:  Active Problems:   Acute respiratory failure (HCC)   Interstitial lung disease (HCC)   Solitary lung nodule   Fracture of multiple ribs   SECONDARY DIAGNOSIS:   Past Medical History  Diagnosis Date  . Hyperlipidemia   . Hypertension   . Coronary artery disease   . Arthritis   . Anemia, iron deficiency 05/17/2012     ADMITTING HISTORY  Luis Rowe is a 82 y.o. male presenting with loss of energy and no appetite and some weight loss of 10 pounds over the last 3 weeks. He states he has been trying to drink 2 boosts per day. He eats a piece of toast and drinks coffee and apple juice for breakfast. He goes down for 1 meal per day. He complains of shortness of breath with exertion. He tries to walk with his walker. He was seen by his medical doctor today and sent into the ER for further evaluation for his pulse ox being 73% on room air. As per PMD note, the patient does have a history of interstitial lung disease.   HOSPITAL COURSE:   1. Interstitial lung disease with chronic respiratory failure Patient did not have any pneumonia on his CT scan. He was set up with home oxygen 2-3 L/m continuous. Pulmonary follow-up. Inhalers as needed. No need for steroids or antibiotics.  2. Right upper lobe pulmonary nodule He does have history of tobacco abuse in the past. Has been set up with pulmonary follow-up for further workup including repeat CT scan or biopsy.  3. Minimal elevation of troponin without chest pain. No  significant increase on repeat troponin. EKG showed no acute changes. Likely demand ischemia.  4. CKD stage III is stable  5. Chest pain due to old right-sided rib fractures is stable.  6. Weight loss and poor appetite This is likely from his chronic hypoxia. Also his interstitial lung disease and pulmonary nodules could be contributing and he definitely needs further workup for lung cancer. Discussed this with patient and daughter at bedside extensively. Home health has been set up due to significant shortness of breath on ambulating.  Patient did not need any further inpatient workup or treatment and was discharged home in a stable condition on oxygen.    CONSULTS OBTAINED:     DRUG ALLERGIES:   Allergies  Allergen Reactions  . Crestor [Rosuvastatin Calcium]     myalgias  . Lipitor [Atorvastatin Calcium]     myalgias  . Zocor [Simvastatin - High Dose]     myalgias    DISCHARGE MEDICATIONS:   Discharge Medication List as of 11/07/2015 11:04 AM    START taking these medications   Details  albuterol (PROVENTIL HFA;VENTOLIN HFA) 108 (90 Base) MCG/ACT inhaler Inhale 2 puffs into the lungs every 6 (six) hours as needed for wheezing or shortness of breath., Starting 11/07/2015, Until Discontinued, Normal    HYDROcodone-acetaminophen (NORCO) 5-325 MG tablet Take 0.5 tablets by mouth every 8 (eight) hours as needed for moderate pain., Starting 11/07/2015, Until Discontinued, Print  CONTINUE these medications which have NOT CHANGED   Details  aspirin EC 81 MG tablet Take 81 mg by mouth daily., Until Discontinued, Historical Med    Calcium Carbonate-Vitamin D (CALCIUM-VITAMIN D) 500-200 MG-UNIT per tablet Take 1 tablet by mouth 2 (two) times daily. , Until Discontinued, Historical Med    Doxylamine Succinate, Sleep, (SLEEP AID PO) Take 1 tablet by mouth at bedtime. 1/2 daily, Until Discontinued, Historical Med    ezetimibe (ZETIA) 10 MG tablet Take 1 tablet (10 mg total) by  mouth daily., Starting 08/07/2015, Until Discontinued, Print    fenofibrate (TRICOR) 145 MG tablet Take 145 mg by mouth daily., Until Discontinued, Historical Med    Magnesium 400 MG CAPS Take 400 mg by mouth daily. , Until Discontinued, Historical Med    metoprolol succinate (TOPROL-XL) 50 MG 24 hr tablet Take 50 mg by mouth daily. , Until Discontinued, Historical Med    Multiple Vitamins-Minerals (PRESERVISION AREDS 2 PO) Take 1 tablet by mouth 2 (two) times daily. , Until Discontinued, Historical Med    Naproxen Sodium (ALEVE) 220 MG CAPS Take 1 capsule by mouth daily. , Until Discontinued, Historical Med    Omega-3 Fatty Acids (FISH OIL) 1000 MG CAPS Take 1,000 mg by mouth 2 (two) times daily. Reported on 10/10/2015, Until Discontinued, Historical Med    omeprazole (PRILOSEC) 20 MG capsule Take 20 mg by mouth daily.  , Until Discontinued, Historical Med    polyethylene glycol (MIRALAX / GLYCOLAX) packet Take 17 g by mouth daily. , Until Discontinued, Historical Med    Selenium 200 MCG CAPS Take 200 mcg by mouth daily. , Until Discontinued, Historical Med      STOP taking these medications     ibuprofen (ADVIL,MOTRIN) 600 MG tablet      traMADol (ULTRAM) 50 MG tablet          Today    VITAL SIGNS:  Blood pressure 122/53, pulse 83, temperature 97.4 F (36.3 C), temperature source Oral, resp. rate 18, height  (1.803 m), weight 77.111 kg (170 lb), SpO2 93 %.  I/O:  No intake or output data in the 24 hours ending 11/08/15 1448  PHYSICAL EXAMINATION:  Physical Exam  GENERAL:  82 y.o.-year-old patient lying in the bed with no acute distress.  LUNGS: Normal breath sounds bilaterally, no wheezing, rales,rhonchi or crepitation. No use of accessory muscles of respiration.  CARDIOVASCULAR: S1, S2 normal. No murmurs, rubs, or gallops.  ABDOMEN: Soft, non-tender, non-distended. Bowel sounds present. No organomegaly or mass.  NEUROLOGIC: Moves all 4  extremities. PSYCHIATRIC: The patient is alert and oriented x 3.  SKIN: No obvious rash, lesion, or ulcer.   DATA REVIEW:   CBC  Recent Labs Lab 11/07/15 0510  WBC 4.0  HGB 11.3*  HCT 35.0*  PLT 246    Chemistries   Recent Labs Lab 11/07/15 0510  NA 140  K 4.4  CL 109  CO2 25  GLUCOSE 119*  BUN 51*  CREATININE 1.36*  CALCIUM 9.3    Cardiac Enzymes  Recent Labs Lab 11/06/15 2104  TROPONINI 0.10*    Microbiology Results  Results for orders placed or performed during the hospital encounter of 11/06/15  MRSA PCR Screening     Status: None   Collection Time: 11/06/15  5:36 PM  Result Value Ref Range Status   MRSA by PCR NEGATIVE NEGATIVE Final    Comment:        The GeneXpert MRSA Assay (FDA approved for NASAL specimens only),  is one component of a comprehensive MRSA colonization surveillance program. It is not intended to diagnose MRSA infection nor to guide or monitor treatment for MRSA infections.     RADIOLOGY:  No results found.    Follow up with PCP in 1 week.  Management plans discussed with the patient, family and they are in agreement.  CODE STATUS:  Code Status History    Date Active Date Inactive Code Status Order ID Comments User Context   11/06/2015  1:37 PM 11/07/2015  5:39 PM Full Code [id number]  Alford Highland, MD ED    Advance Directive Documentation        Most Recent Value   Type of Advance Directive  Living will   Pre-existing out of facility DNR order (yellow form or pink MOST form)     "MOST" Form in Place?        TOTAL TIME TAKING CARE OF THIS PATIENT ON DAY OF DISCHARGE: more than 30 minutes.    Milagros Loll R M.D on 11/08/2015 at 2:48 PM  Between 7am to 6pm - Pager - [phone]  After 6pm go to [email or web address] - password [email or web address]  [facility]  Office  [phone]  CC: Primary care physician; Marikay Alar, MD     Note: This dictation was prepared with Dragon dictation along with  smaller phrase technology. Any transcriptional errors that result from this process are unintentional.

## 2015-11-08 NOTE — Telephone Encounter (Signed)
Unable to reach patient.  No answer. No voicemail. Will call again tomorrow morning to follow up with transitional care management.

## 2015-11-09 ENCOUNTER — Telehealth: Payer: Self-pay

## 2015-11-09 NOTE — Telephone Encounter (Signed)
Transition Care Management Follow-up Telephone Call   Date discharged? 11/07/15   How have you been since you were released from the hospital? Still weak, no shortness of breath.  Home oxygen in use.     Do you understand why you were in the hospital? Yes   Do you understand the discharge instructions? Yes, my daughters are helping me out. Moving slowly between activities.   Where were you discharged to? Home   Items Reviewed:  Medications reviewed: Yes, taking all scheduled medications with no problems  Allergies reviewed:Yes, no changes   Dietary changes reviewed: Yes, no changes  Referrals reviewed: Yes, appointment made with Pulmonary   Functional Questionnaire:   Activities of Daily Living (ADLs):   He states they are independent in the following: Toileting, self feeding, grooming States they require assistance with the following: Ambulating (uses walker/cane), bathing, dressing, meal prep (daughters and home health to assist)   Any transportation issues/concerns?: No   Any patient concerns? Not at this time.   Confirmed importance and date/time of follow-up visits scheduled Yes, appointment scheduled 11-14-15.  Provider Appointment booked with Dr. Birdie Sons (PCP).  Confirmed with patient if condition begins to worsen call PCP or go to the ER.  Patient was given the office number and encouraged to call back with question or concerns.  : Yes, patient verbalized understanding.

## 2015-11-13 ENCOUNTER — Ambulatory Visit: Payer: Medicare Other | Admitting: Family Medicine

## 2015-11-14 ENCOUNTER — Ambulatory Visit (INDEPENDENT_AMBULATORY_CARE_PROVIDER_SITE_OTHER): Payer: Medicare Other | Admitting: Family Medicine

## 2015-11-14 ENCOUNTER — Ambulatory Visit: Payer: Medicare Other | Admitting: Internal Medicine

## 2015-11-14 ENCOUNTER — Encounter: Payer: Self-pay | Admitting: Family Medicine

## 2015-11-14 ENCOUNTER — Ambulatory Visit (INDEPENDENT_AMBULATORY_CARE_PROVIDER_SITE_OTHER): Payer: Medicare Other | Admitting: Internal Medicine

## 2015-11-14 ENCOUNTER — Telehealth: Payer: Self-pay

## 2015-11-14 ENCOUNTER — Ambulatory Visit: Payer: Medicare Other | Admitting: Family Medicine

## 2015-11-14 ENCOUNTER — Encounter: Payer: Self-pay | Admitting: Internal Medicine

## 2015-11-14 VITALS — BP 106/68 | HR 71 | Temp 97.4°F | Ht 71.0 in | Wt 167.0 lb

## 2015-11-14 VITALS — BP 126/72 | HR 85 | Ht 71.0 in | Wt 165.0 lb

## 2015-11-14 DIAGNOSIS — J8410 Pulmonary fibrosis, unspecified: Secondary | ICD-10-CM | POA: Diagnosis not present

## 2015-11-14 DIAGNOSIS — J849 Interstitial pulmonary disease, unspecified: Secondary | ICD-10-CM | POA: Diagnosis not present

## 2015-11-14 DIAGNOSIS — F329 Major depressive disorder, single episode, unspecified: Secondary | ICD-10-CM

## 2015-11-14 DIAGNOSIS — R911 Solitary pulmonary nodule: Secondary | ICD-10-CM | POA: Diagnosis not present

## 2015-11-14 DIAGNOSIS — F32A Depression, unspecified: Secondary | ICD-10-CM

## 2015-11-14 MED ORDER — PREDNISONE 10 MG PO TABS: 10.0000 mg | ORAL_TABLET | Freq: Every day | ORAL | Status: AC

## 2015-11-14 MED ORDER — UMECLIDINIUM-VILANTEROL 62.5-25 MCG/INH IN AEPB: 1.0000 | INHALATION_SPRAY | Freq: Every day | RESPIRATORY_TRACT | Status: DC

## 2015-11-14 MED ORDER — MIRTAZAPINE 7.5 MG PO TABS: 7.5000 mg | ORAL_TABLET | Freq: Every day | ORAL | Status: DC

## 2015-11-14 MED ORDER — UMECLIDINIUM-VILANTEROL 62.5-25 MCG/INH IN AEPB: 1.0000 | INHALATION_SPRAY | Freq: Every day | RESPIRATORY_TRACT | Status: AC

## 2015-11-14 MED ORDER — MIRTAZAPINE 7.5 MG PO TABS: 7.5000 mg | ORAL_TABLET | Freq: Every day | ORAL | Status: AC

## 2015-11-14 NOTE — Assessment & Plan Note (Addendum)
Patient has interstitial lung disease now on home oxygen. He appears breathing comfortably in the office today. He is being followed by pulmonology for this. They started him on inhalers. I agree with their assessment that he does need assisted living in the very least if not skilled nursing. [facility] paperwork was filled out regarding this. I also agree that getting hospice involved will be beneficial. We'll continue to monitor. Given return precautions.

## 2015-11-14 NOTE — Progress Notes (Signed)
Pre visit review using our clinic review tool, if applicable. No additional management support is needed unless otherwise documented below in the visit note. 

## 2015-11-14 NOTE — Progress Notes (Signed)
Patient ID: Luis Rowe, male   DOB: 1926/02/04, 82 y.o.   MRN: [id number]  Luis Alar, MD Phone: [phone]  Luis Rowe is a 82 y.o. male who presents today for follow-up.  Patient last seen 11/06/15 for shortness of breath. He was sent to the hospital for evaluation and admitted for hypoxia. He had known interstitial lung disease based off of prior chest x-ray, though CT scan revealed quite severe interstitial lung disease. He was placed on oxygen and sent home with oxygen. He had follow-up with pulmonology this morning. Since discharge he reports he has not worsened. He notes his breathing is similar to when he is discharged. He notes he feels weak and has a lack of appetite. He is not eating much. He's lost 15 pounds in the last 4 weeks. He's been using his oxygen daily. He is on 2 L by nasal cannula. He denies chest pain. He notes he has adopted a defeatist attitude regarding his medical situation. He feels very down about this. He states he does not want to feel this way. He notes he does think about how he would be better off not being alive with his conditions, though he has no plan or intent to harm himself. He reports he is still aware of his rib discomfort mostly only when he is laying down. No pain at baseline. Notes they had a discussion with the pulmonologist today about moving to an assisted living facility and getting hospice involved. Patient states that hospice referral was placed by the pulmonologist. They brought in paperwork for [facility] benefits to be filled out today.  PMH: Former smoker.   ROS see history of present illness  Objective  Physical Exam Filed Vitals:   11/14/15 1039  BP: 106/68  Pulse: 71  Temp: 97.4 F (36.3 C)    Physical Exam  Constitutional: No distress.  HENT:  Head: Normocephalic and atraumatic.  Nasal cannula in place  Cardiovascular: Normal rate, regular rhythm and normal heart sounds.  Exam reveals no gallop and no friction rub.   No  murmur heard. Pulmonary/Chest: Effort normal. He has no wheezes.  Expiratory and inspiratory crackles noted throughout  Neurological: He is alert.  5/5 strength in bilateral biceps, triceps, grip, quads, hamstrings, plantar and dorsiflexion, sensation to light touch intact in bilateral UE and LE, slow gait with walker  Skin: Skin is warm and dry. He is not diaphoretic.  Psychiatric:  Mood depressed affect depressed   no right-sided rib discomfort on palpation   Assessment/Plan: Please see individual problem list.  Interstitial lung disease (HCC) Patient has interstitial lung disease now on home oxygen. He appears breathing comfortably in the office today. He is being followed by pulmonology for this. They started him on inhalers. I agree with their assessment that he does need assisted living in the very least if not skilled nursing. VA paperwork was filled out regarding this. I also agree that getting hospice involved will be beneficial. We'll continue to monitor. Given return precautions.  Depression Patient endorses depression. He also notes thoughts that he would be better off being dead given his current medical issues. He denies any intent or plan to harm himself. He has additionally been losing weight and having decreased appetite. Given his symptoms of depression and decreased appetite and weight loss we will start him on Remeron at 7.5 mg daily. I discussed seeing a therapist and seeing what resources hospice has regarding this. If they are unable to provide resources we can get  him in touch with a therapist. I advised him that if he were to have increasing thoughts of being better off dead or if he were to develop intent or plan to harm himself he should call 911 and go to the emergency room. He voiced understanding. He is given return precautions.    Meds ordered this encounter  Medications  . DISCONTD: mirtazapine (REMERON) 7.5 MG tablet    Sig: Take 1 tablet (7.5 mg total) by  mouth at bedtime.    Dispense:  30 tablet    Refill:  1  . mirtazapine (REMERON) 7.5 MG tablet    Sig: Take 1 tablet (7.5 mg total) by mouth at bedtime.    Dispense:  30 tablet    Refill:  1     Luis Rowe

## 2015-11-14 NOTE — Patient Instructions (Signed)
Nice to see you. We will start you on Remeron to help with her appetite and her depression. If you develop increasing thoughts of harming yourself, or intent or plan to harm yourself please seek medical attention immediately. If you develop shortness of breath, chest pain, fevers, or any new or changing symptoms please seek medical attention immediately. Please continue to use your oxygen. Please follow up with hospice and if you have not heard anything from them in the next several days please let us know.

## 2015-11-14 NOTE — Telephone Encounter (Signed)
Needs Clarification on Prednisone. Please advise

## 2015-11-14 NOTE — Progress Notes (Signed)
[facility] [place] [facility] Consultation      Assessment and Plan:  Pulmonary Fibrosis -Severe end-stage disease with extensive bilateral interstitial changes. -Given the severity of his dyspnea, and debility, as well as  advanced age, I would not recommend aggressive therapy or further workup. -The majority of today's time was spent with 30 minutes in counseling. Given the patient's advanced disease. I recommended that he be enrolled in palliative care/hospice. We also discussed ways to try to improve his quality of life, such as increasing his activity level, and transitioning him from the current independent living status to an assisted-living status. -We will also discuss some other end-of-life issues, I recommended that he change his CODE STATUS to DO NOT RESUSCITATE as if he was put on the ventilator. He would likely not be able to come off, he and his daughter are in agreement with this and would agree that if he should come into the emergency room. He would like to focus on making him comfortable.  -Over the next  few weeks and months. I discussed with the patient and his daughter that we will try to get him back to his previous functional status. I will start him on an oral inhaler for COPD, and addition, I will start him on prednisone at 30 mg daily. I've recommended that he continue his physical therapy, and overall try to increase his physical activity level. This will be the best chance for him to get back to his previous functional status.   Lung nodule.  -Small area of nodular scar and a larger area of pulmonary scar in the right upper lobe. Uncertain if this represents a true pulmonary nodule. -Given the advanced fibrotic and bullous lung disease, he would be at very high risk of complications with any biopsy. Therefore, would not pursue this given his advanced lung disease and likely limited lifespan.  Rib Fracture.  -The patient's severe chronic lung disease, likely  exacerbated by the recent fall with rib fracture and atelectasis leading to some splinting. This likely led to the acute respiratory failure.  Acute on Chronic respiratory failure.  -Acute hypoxic respiratory failure, due to atelectasis and pulmonary fibrosis on chronic respiratory failure due to pulmonary fibrosis.  Date: 11/14/2015  MRN# [id number] Luis Rowe 15-Nov-1925  Referring Physician: Dr. Birdie Sons.   Luis Rowe is a 82 y.o. old male seen in consultation for chief complaint of:    Chief Complaint  Patient presents with  . Hospitalization Follow-up    pt. states breathing is baseline since hosp. still feels weak. SOB. occ. dry cough sometimes prod. white in color. denies wheezing or chest pain/tightness.    HPI:   The patient is an 82 year old male who presented to the hospital recently with episode of acute respiratory failure. It was found that he was significantly hypoxic, during that evaluation, it was noted that he appeared to have a history of interstitial lung disease. He was also noted to have a right upper lobe pulmonary nodule, as well as stage 3 CKD, 10 pound weight loss over the previous 3 weeks. He is here with his daughter today who gives much of the history.  He lives in a independent living community . He had a fall late last year and he developed worsening appetite and reduced energy. When he went to see his doctor, the oxygen was noted to be low, and he was sent to the ER. There he was noted that he had ILD, he was there for 1 day  and discharged.   He continues to feel tired and run down, he wants to "throw in the towel", his daughter notes that he is much more confused than prior to his fall. Soon after the fall he was taken to urgent care on 09/17/15 he was treated for a possible pneumonia and was treated with levaquin.  He thinks he does some PT at home. He notes that he continues to feel weak with poor appetite.  He used to smoke up to 2 ppd, stopped 35  years ago. He has been widowed for about 23 years. He used work for [employer], he never worked with asbestos, he worked as an Public affairs consultant.   He was ambulated around the office for about 200 feet, unsteady gait, required support. Beginning sat was 91%, HR 65, after sat 82% HR 84, moderate dyspnea.   Images and report reviewed from CT chest 11/06/2015: This was compared with x-rays from same time, there is severe bullous/cystic changes throughout both lungs, there is also interstitial changes and scarring in both lungs, which is worst in the bases. There is an area of bandlike scarring in the right upper lobe and one part of this appears nodular. There is right paratracheal, subcarinal, hilar lymphadenopathy. Comparison of current chest x-ray with previous chest x-ray from 2011 shows progressive interstitial disease.  PMHX:   Past Medical History  Diagnosis Date  . Hyperlipidemia   . Hypertension   . Coronary artery disease   . Arthritis   . Anemia, iron deficiency 05/17/2012   Surgical Hx:  Past Surgical History  Procedure Laterality Date  . Other surgical history      tonsilectomy  . Circumcision  1968  . Cardiac catheterization  05/05/2000    stents  . Cardiac catheterization  01/13/2000    stents and ptca  . Joint replacement Left     knee  . Tonsillectomy  1935   Family Hx:  Family History  Problem Relation Age of Onset  . Breast cancer Mother   . Cancer Mother   . Heart disease Father   . Breast cancer Sister   . Cancer Sister    Social Hx:   Social History  Substance Use Topics  . Smoking status: Former Smoker    Types: Cigarettes  . Smokeless tobacco: Not on file  . Alcohol Use: No   Medication:   Current Outpatient Rx  Name  Route  Sig  Dispense  Refill  . albuterol (PROVENTIL HFA;VENTOLIN HFA) 108 (90 Base) MCG/ACT inhaler   Inhalation   Inhale 2 puffs into the lungs every 6 (six) hours as needed for wheezing or shortness of breath.   1  Inhaler   2   . aspirin EC 81 MG tablet   Oral   Take 81 mg by mouth daily.         . Calcium Carbonate-Vitamin D (CALCIUM-VITAMIN D) 500-200 MG-UNIT per tablet   Oral   Take 1 tablet by mouth 2 (two) times daily.          . Doxylamine Succinate, Sleep, (SLEEP AID PO)   Oral   Take 1 tablet by mouth at bedtime. 1/2 daily         . ezetimibe (ZETIA) 10 MG tablet   Oral   Take 1 tablet (10 mg total) by mouth daily.   90 tablet   1   . fenofibrate (TRICOR) 145 MG tablet   Oral   Take 145 mg by mouth daily.         Marland Kitchen  HYDROcodone-acetaminophen (NORCO) 5-325 MG tablet   Oral   Take 0.5 tablets by mouth every 8 (eight) hours as needed for moderate pain.   20 tablet   0   . Magnesium 400 MG CAPS   Oral   Take 400 mg by mouth daily.          . metoprolol succinate (TOPROL-XL) 50 MG 24 hr tablet   Oral   Take 50 mg by mouth daily.          . Multiple Vitamins-Minerals (PRESERVISION AREDS 2 PO)   Oral   Take 1 tablet by mouth 2 (two) times daily.          . Naproxen Sodium (ALEVE) 220 MG CAPS   Oral   Take 1 capsule by mouth daily.          . Omega-3 Fatty Acids (FISH OIL) 1000 MG CAPS   Oral   Take 1,000 mg by mouth 2 (two) times daily. Reported on 10/10/2015         . omeprazole (PRILOSEC) 20 MG capsule   Oral   Take 20 mg by mouth daily.           . polyethylene glycol (MIRALAX / GLYCOLAX) packet   Oral   Take 17 g by mouth daily.          . Selenium 200 MCG CAPS   Oral   Take 200 mcg by mouth daily.              Allergies:  Crestor; Lipitor; and Zocor  Review of Systems: Gen:  Denies  fever, sweats, chills HEENT: Denies blurred vision, double vision.  Cvc:  No dizziness, chest pain. Resp:   Denies cough or sputum porduction,  Gi: Denies swallowing difficulty, stomach pain. Gu:  Denies bladder incontinence, burning urine Ext:   No Joint pain, stiffness. Skin: No skin rash,  hives Endoc:  No polyuria, polydipsia. Psych: No  depression, insomnia. Other:  All other systems were reviewed with the patient and were negative other that what is mentioned in the HPI.   Physical Examination:   VS: BP 126/72 mmHg  Pulse 85  Ht  (1.803 m)  Wt 165 lb (74.844 kg)  BMI 23.02 kg/m2  SpO2 92%  General Appearance: No distress  Neuro:without focal findings,  speech normal,  HEENT: PERRLA, EOM intact.   Pulmonary: Diffuse bilateral inspiratory and expiratory crackles..  CardiovascularNormal S1,S2.  No m/r/g.   Abdomen: Benign, Soft, non-tender. Renal:  No costovertebral tenderness  GU:  No performed at this time. Endoc: No evident thyromegaly, no signs of acromegaly. Skin:   warm, no rashes, no ecchymosis  Extremities: normal, no cyanosis, clubbing.  Other findings:    LABORATORY PANEL:   CBC No results for input(s): WBC, HGB, HCT, PLT in the last 168 hours. ------------------------------------------------------------------------------------------------------------------  Chemistries  No results for input(s): NA, K, CL, CO2, GLUCOSE, BUN, CREATININE, CALCIUM, MG, AST, ALT, ALKPHOS, BILITOT in the last 168 hours.  Invalid input(s): GFRCGP ------------------------------------------------------------------------------------------------------------------  Cardiac Enzymes No results for input(s): TROPONINI in the last 168 hours. ------------------------------------------------------------  RADIOLOGY:  No results found.     Thank  you for the consultation and for allowing [facility] to assist in the care of your patient. Our recommendations are noted above.  Please contact us if we can be of further service.   Wells Guiles, MD.  Board Certified in Internal Medicine, Pulmonary Medicine, Critical Care Medicine, and Sleep Medicine.  [place]  [facility]  Santiago Glad, M.D.  Stephanie Acre, M.D.  Billy Fischer, M.D

## 2015-11-14 NOTE — Patient Instructions (Addendum)
Consult Palliative/Hospice care.   Start prednisone 30 mg daily.   --Continue oxygen.   --Start Anoro inhaler once daily.

## 2015-11-14 NOTE — Assessment & Plan Note (Signed)
Patient endorses depression. He also notes thoughts that he would be better off being dead given his current medical issues. He denies any intent or plan to harm himself. He has additionally been losing weight and having decreased appetite. Given his symptoms of depression and decreased appetite and weight loss we will start him on Remeron at 7.5 mg daily. I discussed seeing a therapist and seeing what resources hospice has regarding this. If they are unable to provide resources we can get him in touch with a therapist. I advised him that if he were to have increasing thoughts of being better off dead or if he were to develop intent or plan to harm himself he should call 911 and go to the emergency room. He voiced understanding. He is given return precautions.

## 2015-11-14 NOTE — Telephone Encounter (Signed)
This encounter was created in error - please disregard.

## 2015-11-14 NOTE — Telephone Encounter (Signed)
Gave clarification to Kathlene November that pt is to take  daily. Nothing further needed.

## 2015-11-15 ENCOUNTER — Telehealth: Payer: Self-pay | Admitting: *Deleted

## 2015-11-15 NOTE — Telephone Encounter (Signed)
Looks like this should have gone to pulmonary.

## 2015-11-15 NOTE — Telephone Encounter (Signed)
Nurse Lawson Fiscal  needs orders for patient so he can be transferred into their care. Please fax those orders.  Fax [phone]

## 2015-11-15 NOTE — Telephone Encounter (Signed)
See below

## 2015-11-19 ENCOUNTER — Encounter: Payer: Self-pay | Admitting: *Deleted

## 2015-11-19 ENCOUNTER — Encounter
Admission: RE | Admit: 2015-11-19 | Discharge: 2015-11-19 | Disposition: A | Discharge status: Home / Self Care | Payer: Medicare Other | Source: Ambulatory Visit | Attending: Internal Medicine | Admitting: Internal Medicine

## 2015-11-19 NOTE — Telephone Encounter (Signed)
Letter printed and signed by VM for pt to move to assisted living from independent living at [facility]. Faxed attnLawson Fiscal to [phone]. Nothing further needed.

## 2015-11-21 ENCOUNTER — Encounter
Admission: RE | Admit: 2015-11-21 | Discharge: 2015-11-21 | Disposition: A | Discharge status: Home / Self Care | Payer: Medicare Other | Source: Ambulatory Visit | Attending: Internal Medicine | Admitting: Internal Medicine

## 2015-11-30 ENCOUNTER — Ambulatory Visit: Payer: Medicare Other | Admitting: Family Medicine

## 2015-12-19 ENCOUNTER — Encounter
Admission: RE | Admit: 2015-12-19 | Discharge: 2015-12-19 | Disposition: A | Discharge status: Home / Self Care | Payer: Medicare Other | Source: Ambulatory Visit | Attending: Internal Medicine | Admitting: Internal Medicine

## 2016-01-19 ENCOUNTER — Encounter
Admission: RE | Admit: 2016-01-19 | Discharge: 2016-01-19 | Disposition: A | Discharge status: Home / Self Care | Payer: Medicare Other | Source: Ambulatory Visit | Attending: Internal Medicine | Admitting: Internal Medicine

## 2016-02-05 ENCOUNTER — Ambulatory Visit (INDEPENDENT_AMBULATORY_CARE_PROVIDER_SITE_OTHER): Admitting: Internal Medicine

## 2016-02-05 ENCOUNTER — Telehealth: Payer: Self-pay | Admitting: Internal Medicine

## 2016-02-05 ENCOUNTER — Encounter: Payer: Self-pay | Admitting: Internal Medicine

## 2016-02-05 VITALS — BP 120/62 | HR 94 | Ht 72.0 in | Wt 182.4 lb

## 2016-02-05 DIAGNOSIS — J8410 Pulmonary fibrosis, unspecified: Secondary | ICD-10-CM | POA: Diagnosis not present

## 2016-02-05 MED ORDER — UMECLIDINIUM-VILANTEROL 62.5-25 MCG/INH IN AEPB: 1.0000 | INHALATION_SPRAY | Freq: Every day | RESPIRATORY_TRACT | Status: AC

## 2016-02-05 NOTE — Telephone Encounter (Signed)
Daughter states that when they got back from the appt this morning that the hospice nurse came in and stated that they were releasing him due to there is no note of decline and gave daughter the appeals form. We placed order for Hopice in January 2017. Daughter states if you see any different to let her know what we need to do to keep him in hospice.

## 2016-02-05 NOTE — Telephone Encounter (Signed)
Patient's daughter called and said that patient is being released from hospice. Any concern's please call Rosey Batheresa. Thanks!

## 2016-02-05 NOTE — Telephone Encounter (Signed)
LMOM for daughter to call back.

## 2016-02-05 NOTE — Patient Instructions (Addendum)
--  Continue to try to increase your activity.   --Continue prednisone, anoro, breo inhalers.   --PFT and follow up in 3 months.

## 2016-02-05 NOTE — Progress Notes (Signed)
[facility] [place] Pulmonary Medicine Consultation      Assessment and Plan:  Pulmonary Fibrosis -Severe end-stage disease with extensive bilateral interstitial changes. -Over the next  few weeks and months. I discussed with the patient and his daughter that we will try to get him back to his previous functional status. Continue Brio, Anoro and prednisone at 30 mg daily. I've recommended that he continue trying to increase physical activity level This will be the best chance to maintain functional status at current level.    Lung nodule.  -Small area of nodular scar and a larger area of pulmonary scar in the right upper lobe. Uncertain if this represents a true pulmonary nodule. -Given the advanced fibrotic and bullous lung disease, he would be at very high risk of complications with any biopsy. Therefore, would not pursue this given his advanced lung disease and likely limited lifespan. -Patient's daughter has questions about whether we could repeat a chest x-ray to look at this nodule. I explained that chest x-ray likely does not have the adequate resolution to show any significant changes in this nodule. In addition, the patient would require a tissue biopsy, which would place him at high risk. There is also the issue that the patient is more likely to die from this pulmonary fibrosis at this point, than from cancer.  Rib Fracture.  -The patient's severe chronic lung disease, likely exacerbated by the recent fall with rib fracture and atelectasis leading to some splinting. This likely led to the acute respiratory failure.  Acute on Chronic respiratory failure.  -Acute hypoxic respiratory failure, due to atelectasis and pulmonary fibrosis on chronic respiratory failure due to pulmonary fibrosis.  Date: 02/05/2016  MRN# [id number] Luis Rowe 20-Dec-1925  Referring Physician: Dr. Birdie Sons.   Luis Rowe is a 59 y.o. old male seen in consultation for chief complaint of:    Chief Complaint    Patient presents with  . Follow-up    pt states breathing is baseline. c/o SOB & occ prod cough yellowish in color.    HPI:    The patient is an 82 yo male with severe pulmonary fibrosis. At his last visit we discussed the severe nature of his pulmonary disease. We also discussed maximizing his remaining function to see if we could help improve his breathing. He was started on prednisone at 30 mg daily, and an Anoro inhaler. He was asked to try to increase his activity level.   He has not been doing very well trying the increase his activity. He has been using Breo inhaler, at last visit he was started on anoro, but he does not think that he is using it.   Images and report reviewed from CT chest 11/06/2015: This was compared with x-rays from same time, there is severe bullous/cystic changes throughout both lungs, there is also interstitial changes and scarring in both lungs, which is worst in the bases. There is an area of bandlike scarring in the right upper lobe and one part of this appears nodular. There is right paratracheal, subcarinal, hilar lymphadenopathy. Comparison of current chest x-ray with previous chest x-ray from 2011 shows progressive interstitial disease.  PMHX:   Past Medical History  Diagnosis Date  . Hyperlipidemia   . Hypertension   . Coronary artery disease   . Arthritis   . Anemia, iron deficiency 05/17/2012   Surgical Hx:  Past Surgical History  Procedure Laterality Date  . Other surgical history      tonsilectomy  . Circumcision  1968  . Cardiac catheterization  05/05/2000    stents  . Cardiac catheterization  01/13/2000    stents and ptca  . Joint replacement Left     knee  . Tonsillectomy  1935   Family Hx:  Family History  Problem Relation Age of Onset  . Breast cancer Mother   . Cancer Mother   . Heart disease Father   . Breast cancer Sister   . Cancer Sister    Social Hx:   Social History  Substance Use Topics  . Smoking status: Former  Smoker    Types: Cigarettes  . Smokeless tobacco: Not on file  . Alcohol Use: No   Medication:   Current Outpatient Rx  Name  Route  Sig  Dispense  Refill  . albuterol (PROVENTIL HFA;VENTOLIN HFA) 108 (90 Base) MCG/ACT inhaler   Inhalation   Inhale 2 puffs into the lungs every 6 (six) hours as needed for wheezing or shortness of breath.   1 Inhaler   2   . aspirin EC 81 MG tablet   Oral   Take 81 mg by mouth daily.         Marland Kitchen. ezetimibe (ZETIA) 10 MG tablet   Oral   Take 1 tablet (10 mg total) by mouth daily.   90 tablet   1   . fenofibrate (TRICOR) 145 MG tablet   Oral   Take 145 mg by mouth daily.         Marland Kitchen. HYDROcodone-acetaminophen (NORCO) 5-325 MG tablet   Oral   Take 0.5 tablets by mouth every 8 (eight) hours as needed for moderate pain.   20 tablet   0   . ibuprofen (ADVIL,MOTRIN) 600 MG tablet      TK 1 T PO Q 8 H PRN      1   . metoprolol succinate (TOPROL-XL) 50 MG 24 hr tablet   Oral   Take 50 mg by mouth daily.          . mirtazapine (REMERON) 7.5 MG tablet   Oral   Take 1 tablet (7.5 mg total) by mouth at bedtime.   30 tablet   1   . Multiple Vitamins-Minerals (PRESERVISION AREDS 2 PO)   Oral   Take 1 tablet by mouth 2 (two) times daily.          . Omega-3 Fatty Acids (FISH OIL) 1000 MG CAPS   Oral   Take 1,000 mg by mouth 2 (two) times daily. Reported on 10/10/2015         . omeprazole (PRILOSEC) 20 MG capsule   Oral   Take 20 mg by mouth daily.           . polyethylene glycol (MIRALAX / GLYCOLAX) packet   Oral   Take 17 g by mouth daily.          . predniSONE (DELTASONE) 10 MG tablet   Oral   Take 1 tablet (10 mg total) by mouth daily with breakfast.   90 tablet   5     30 mg daily   . Umeclidinium-Vilanterol (ANORO ELLIPTA) 62.5-25 MCG/INH AEPB   Inhalation   Inhale 1 puff into the lungs daily.   60 each   5       Allergies:  Crestor; Lipitor; and Zocor  Review of Systems: Gen:  Denies  fever, sweats,  chills HEENT: Denies blurred vision, double vision.  Cvc:  No dizziness, chest pain. Resp:   Denies cough or  sputum porduction,  Gi: Denies swallowing difficulty, stomach pain. Gu:  Denies bladder incontinence, burning urine Ext:   No Joint pain, stiffness. Skin: No skin rash,  hives Endoc:  No polyuria, polydipsia. Psych: No depression, insomnia. Other:  All other systems were reviewed with the patient and were negative other that what is mentioned in the HPI.   Physical Examination:   VS: BP 120/62 mmHg  Pulse 94  Ht 6' (1.829 m)  Wt 182 lb 6.4 oz (82.736 kg)  BMI 24.73 kg/m2  SpO2 92%  General Appearance: No distress  Neuro:without focal findings,  speech normal,  HEENT: PERRLA, EOM intact.   Pulmonary: Diffuse bilateral inspiratory and expiratory crackles..  CardiovascularNormal S1,S2.  No m/r/g.   Abdomen: Benign, Soft, non-tender. Renal:  No costovertebral tenderness  GU:  No performed at this time. Endoc: No evident thyromegaly, no signs of acromegaly. Skin:   warm, no rashes, no ecchymosis  Extremities: normal, no cyanosis, clubbing.  Other findings:    LABORATORY PANEL:   CBC No results for input(s): WBC, HGB, HCT, PLT in the last 168 hours. ------------------------------------------------------------------------------------------------------------------  Chemistries  No results for input(s): NA, K, CL, CO2, GLUCOSE, BUN, CREATININE, CALCIUM, MG, AST, ALT, ALKPHOS, BILITOT in the last 168 hours.  Invalid input(s): GFRCGP ------------------------------------------------------------------------------------------------------------------  Cardiac Enzymes No results for input(s): TROPONINI in the last 168 hours. ------------------------------------------------------------  RADIOLOGY:  No results found.     Thank  you for the consultation and for allowing [facility] [place] [facility] to assist in the care of your patient. Our recommendations are  noted above.  Please contact us if we can be of further service.   Wells Guiles, MD.  Board Certified in Internal Medicine, Pulmonary Medicine, Critical Care Medicine, and Sleep Medicine.  [facility]  Santiago Glad, M.D.  Stephanie Acre, M.D.  Billy Fischer, M.D

## 2016-02-18 ENCOUNTER — Encounter
Admission: RE | Admit: 2016-02-18 | Discharge: 2016-02-18 | Disposition: A | Discharge status: Home / Self Care | Payer: Medicare Other | Source: Ambulatory Visit | Attending: Internal Medicine | Admitting: Internal Medicine

## 2016-02-22 ENCOUNTER — Ambulatory Visit (INDEPENDENT_AMBULATORY_CARE_PROVIDER_SITE_OTHER): Payer: Medicare Other | Admitting: *Deleted

## 2016-02-22 DIAGNOSIS — J8410 Pulmonary fibrosis, unspecified: Secondary | ICD-10-CM | POA: Diagnosis not present

## 2016-02-22 LAB — PULMONARY FUNCTION TEST
DL/VA % PRED: 49 %
DL/VA: 2.28 ml/min/mmHg/L
DLCO UNC % PRED: 33 %
DLCO UNC: 11.27 ml/min/mmHg
FEF 25-75 Post: 1.48 L/sec
FEF 25-75 Pre: 1.34 L/sec
FEF2575-%Change-Post: 10 %
FEF2575-%Pred-Post: 88 %
FEF2575-%Pred-Pre: 80 %
FEV1-%CHANGE-POST: 1 %
FEV1-%PRED-PRE: 86 %
FEV1-%Pred-Post: 88 %
FEV1-Post: 2.35 L
FEV1-Pre: 2.3 L
FEV1FVC-%Change-Post: 1 %
FEV1FVC-%Pred-Pre: 98 %
FEV6-%Change-Post: 0 %
FEV6-%PRED-POST: 93 %
FEV6-%Pred-Pre: 93 %
FEV6-Post: 3.32 L
FEV6-Pre: 3.34 L
FEV6FVC-%CHANGE-POST: 0 %
FEV6FVC-%PRED-POST: 107 %
FEV6FVC-%PRED-PRE: 107 %
FVC-%Change-Post: 0 %
FVC-%PRED-POST: 87 %
FVC-%PRED-PRE: 86 %
FVC-POST: 3.34 L
FVC-Pre: 3.34 L
POST FEV6/FVC RATIO: 100 %
PRE FEV6/FVC RATIO: 100 %
Post FEV1/FVC ratio: 70 %
Pre FEV1/FVC ratio: 69 %

## 2016-02-22 NOTE — Progress Notes (Signed)
PFT performed today with nitrogen washout. 

## 2016-03-03 ENCOUNTER — Other Ambulatory Visit: Payer: Self-pay | Admitting: Gerontology

## 2016-03-03 DIAGNOSIS — R918 Other nonspecific abnormal finding of lung field: Secondary | ICD-10-CM

## 2016-03-11 ENCOUNTER — Ambulatory Visit
Admission: RE | Admit: 2016-03-11 | Discharge: 2016-03-11 | Disposition: A | Discharge status: Home / Self Care | Payer: Medicare Other | Source: Ambulatory Visit | Attending: Gerontology | Admitting: Gerontology

## 2016-03-11 DIAGNOSIS — K449 Diaphragmatic hernia without obstruction or gangrene: Secondary | ICD-10-CM | POA: Insufficient documentation

## 2016-03-11 DIAGNOSIS — R161 Splenomegaly, not elsewhere classified: Secondary | ICD-10-CM | POA: Diagnosis not present

## 2016-03-11 DIAGNOSIS — R911 Solitary pulmonary nodule: Secondary | ICD-10-CM | POA: Diagnosis present

## 2016-03-11 DIAGNOSIS — I2510 Atherosclerotic heart disease of native coronary artery without angina pectoris: Secondary | ICD-10-CM | POA: Diagnosis not present

## 2016-03-11 DIAGNOSIS — Z9981 Dependence on supplemental oxygen: Secondary | ICD-10-CM | POA: Diagnosis present

## 2016-03-11 DIAGNOSIS — J8410 Pulmonary fibrosis, unspecified: Secondary | ICD-10-CM | POA: Diagnosis present

## 2016-03-11 DIAGNOSIS — J479 Bronchiectasis, uncomplicated: Secondary | ICD-10-CM | POA: Insufficient documentation

## 2016-03-11 DIAGNOSIS — J439 Emphysema, unspecified: Secondary | ICD-10-CM | POA: Diagnosis not present

## 2016-03-11 DIAGNOSIS — I10 Essential (primary) hypertension: Secondary | ICD-10-CM | POA: Insufficient documentation

## 2016-03-11 DIAGNOSIS — R918 Other nonspecific abnormal finding of lung field: Secondary | ICD-10-CM

## 2016-03-20 ENCOUNTER — Encounter
Admission: RE | Admit: 2016-03-20 | Discharge: 2016-03-20 | Disposition: A | Discharge status: Home / Self Care | Source: Ambulatory Visit | Attending: Internal Medicine | Admitting: Internal Medicine

## 2016-03-20 ENCOUNTER — Encounter
Admission: RE | Admit: 2016-03-20 | Discharge: 2016-03-20 | Disposition: A | Discharge status: Home / Self Care | Payer: Medicare Other | Source: Ambulatory Visit | Attending: Internal Medicine | Admitting: Internal Medicine

## 2016-03-20 DIAGNOSIS — E871 Hypo-osmolality and hyponatremia: Secondary | ICD-10-CM | POA: Diagnosis not present

## 2016-03-20 DIAGNOSIS — D649 Anemia, unspecified: Secondary | ICD-10-CM | POA: Diagnosis not present

## 2016-03-20 LAB — COMPREHENSIVE METABOLIC PANEL
ALK PHOS: 62 U/L (ref 38–126)
ALT: 29 U/L (ref 17–63)
ANION GAP: 9 (ref 5–15)
AST: 21 U/L (ref 15–41)
Albumin: 3.8 g/dL (ref 3.5–5.0)
BILIRUBIN TOTAL: 0.4 mg/dL (ref 0.3–1.2)
BUN: 36 mg/dL — ABNORMAL HIGH (ref 6–20)
CALCIUM: 8.9 mg/dL (ref 8.9–10.3)
CO2: 28 mmol/L (ref 22–32)
Chloride: 98 mmol/L — ABNORMAL LOW (ref 101–111)
Creatinine, Ser: 1.2 mg/dL (ref 0.61–1.24)
GFR calc non Af Amer: 52 mL/min — ABNORMAL LOW (ref >60)
Glucose, Bld: 165 mg/dL — ABNORMAL HIGH (ref 65–99)
Potassium: 4.1 mmol/L (ref 3.5–5.1)
Sodium: 135 mmol/L (ref 135–145)
TOTAL PROTEIN: 6.4 g/dL — AB (ref 6.5–8.1)

## 2016-03-20 LAB — CBC WITH DIFFERENTIAL/PLATELET
Basophils Absolute: 0 10*3/uL (ref 0–0.1)
Basophils Relative: 0 %
EOS ABS: 0.1 10*3/uL (ref 0–0.7)
Eosinophils Relative: 2 %
HEMATOCRIT: 32.8 % — AB (ref 40.0–52.0)
HEMOGLOBIN: 10.4 g/dL — AB (ref 13.0–18.0)
LYMPHS ABS: 0.9 10*3/uL — AB (ref 1.0–3.6)
Lymphocytes Relative: 25 %
MCH: 23.6 pg — AB (ref 26.0–34.0)
MCHC: 31.7 g/dL — AB (ref 32.0–36.0)
MCV: 74.5 fL — ABNORMAL LOW (ref 80.0–100.0)
MONOS PCT: 4 %
Monocytes Absolute: 0.2 10*3/uL (ref 0.2–1.0)
NEUTROS ABS: 2.4 10*3/uL (ref 1.4–6.5)
NEUTROS PCT: 69 %
Platelets: 161 10*3/uL (ref 150–440)
RBC: 4.4 MIL/uL (ref 4.40–5.90)
RDW: 15.9 % — ABNORMAL HIGH (ref 11.5–14.5)
WBC: 3.4 10*3/uL — ABNORMAL LOW (ref 3.8–10.6)

## 2016-03-28 ENCOUNTER — Telehealth: Payer: Self-pay | Admitting: Internal Medicine

## 2016-03-28 NOTE — Telephone Encounter (Signed)
Patient's daughter called and wants the PFT results and a copy.

## 2016-03-28 NOTE — Telephone Encounter (Signed)
Please advise 

## 2016-03-31 NOTE — Telephone Encounter (Signed)
His duffusion capacity (the ability of oxygen to get from the lungs into the blood) is severe reduced to about 30%. The other aspects of his test did show much change in lung function from normal. 

## 2016-03-31 NOTE — Telephone Encounter (Signed)
Daughter informed of DR response for the PFT. Nothing further needed.

## 2016-04-04 ENCOUNTER — Telehealth: Payer: Self-pay | Admitting: Internal Medicine

## 2016-04-04 NOTE — Telephone Encounter (Signed)
Pt daughter calling stating she is trying to see PFT results in Mychart But is upset for she can't see them in there Please advise.

## 2016-04-07 NOTE — Telephone Encounter (Signed)
His duffusion capacity (the ability of oxygen to get from the lungs into the blood) is severe reduced to about 30%. The other aspects of his test did show much change in lung function from normal.

## 2016-04-07 NOTE — Telephone Encounter (Signed)
Please advise on pt's PFT. Pt is not scheduled to come in until 04/23/16. Thanks

## 2016-04-07 NOTE — Telephone Encounter (Signed)
Daughter informed and copy sent to daughter per her request.  Nothing further needed.

## 2016-04-19 ENCOUNTER — Encounter
Admission: RE | Admit: 2016-04-19 | Discharge: 2016-04-19 | Disposition: A | Discharge status: Home / Self Care | Payer: Medicare Other | Source: Ambulatory Visit | Attending: Internal Medicine | Admitting: Internal Medicine

## 2016-04-23 ENCOUNTER — Ambulatory Visit (INDEPENDENT_AMBULATORY_CARE_PROVIDER_SITE_OTHER): Payer: Medicare Other | Admitting: Internal Medicine

## 2016-04-23 ENCOUNTER — Encounter: Payer: Self-pay | Admitting: Internal Medicine

## 2016-04-23 VITALS — BP 122/70 | HR 89 | Ht 71.0 in | Wt 192.0 lb

## 2016-04-23 DIAGNOSIS — J8410 Pulmonary fibrosis, unspecified: Secondary | ICD-10-CM | POA: Diagnosis not present

## 2016-04-23 MED ORDER — IPRATROPIUM-ALBUTEROL 0.5-2.5 (3) MG/3ML IN SOLN: 3.0000 mL | Freq: Four times a day (QID) | RESPIRATORY_TRACT | Status: AC

## 2016-04-23 NOTE — Addendum Note (Signed)
Addended by: Meyer CoryAHMAD, Avyanna Spada R on: 04/23/2016 01:31 PM   Modules accepted: Orders

## 2016-04-23 NOTE — Patient Instructions (Addendum)
--  Will start duonebs 4 times daily, will need nebulizer machine.   --Transition to hospice care at [facility].   --Follow up in 3 months.

## 2016-04-23 NOTE — Progress Notes (Signed)
[facility] Pul[facility] Medicine     Assessment and Plan:  Pulmonary Fibrosis -Severe end-stage disease with extensive bilateral interstitial changes. -Over the next few weeks and months. I discussed with the patient and his daughter that we expect his functional status to continue to decline. He has not responded to empiric trial of prednisone, he continues to feel tired and short of breath, functional status continues to decline.  Advance care planning, discussion: -Had a long discussion with the patient and his daughter, he has a terminal disease in pulmonary fibrosis, it does not appear that this will improve. We discussed his goals for end-of-life, he and his daughter reiterates that his CODE STATUS is DO NOT RESUSCITATE, and we will continue with the goal of trying to keep him comfortable and improving  his breathing symptomatically as much as possible. -We will prescribe nebulizer treatments to see if this will symptomatically help with his breathing, he understands that it will not change the course of his disease. -We will enroll him in hosp[facility]. 30 minutes spent in discussion/counselling.   Lung nodule.  -Small area of nodular scar and a larger area of pulmonary scar in the right upper lobe. Uncertain if this represents a true pulmonary nodule. -Given the advanced fibrotic and bullous lung disease, he would be at very high risk of complications with any biopsy. Therefore, would not pursue this given his advanced lung disease and likely limited lifespan.   Rib Fracture.  -The patient's severe chronic lung disease, likely exacerbated by the recent fall with rib fracture and atelectasis leading to some splinting.   Acute on Chronic respiratory failure.  -Acute hypoxic respiratory failure, due to atelectasis and pulmonary fibrosis on chronic respiratory failure due to pulmonary fibrosis.   Date: 04/23/2016  MRN# [id number] Luis Rowe 03/02/1926   Luis Rowe  is a 82 y.o. old male seen in follow up for chief complaint of  Chief Complaint  Patient presents with  . Follow-up    pt states breathing has worsen since last OV. increased sob, occ prod cough w/bloody mucus X2wk. on 2L 02     HPI:   Patient is a 82 year old male with a history of pulmonary fibrosis, and bullous emphysema at last visit he was asked to continue his  Anoro, prednisone at 30 mg daily. Also asked him to try to increase his physical activity level.  He notes that he breathing has continued to decline over the past month, he has been doing less physical activity, he has trouble doing daily activities anymore.  He is using 2L at all times. He is Anoro inhaler once daily, prednisone 30 mg once daily.  [facility]   Medication:   Outpatient Encounter Prescriptions as of 04/23/2016  Medication Sig  . albuterol (PROVENTIL HFA;VENTOLIN HFA) 108 (90 Base) MCG/ACT inhaler Inhale 2 puffs into the lungs every 6 (six) hours as needed for wheezing or shortness of breath.  Marland Kitchen. aspirin EC 81 MG tablet Take 81 mg by mouth daily. Reported on 02/05/2016  . ezetimibe (ZETIA) 10 MG tablet Take 1 tablet (10 mg total) by mouth daily.  . fenofibrate (TRICOR) 145 MG tablet Take 145 mg by mouth daily.  Marland Kitchen. HYDROcodone-acetaminophen (NORCO) 5-325 MG tablet Take 0.5 tablets by mouth every 8 (eight) hours as needed for moderate pain.  Marland Kitchen. ibuprofen (ADVIL,MOTRIN) 600 MG tablet TK 1 T PO Q 8 H PRN  . metoprolol succinate (TOPROL-XL) 50 MG  24 hr tablet Take 50 mg by mouth daily.   . mirtazapine (REMERON) 7.5 MG tablet Take 1 tablet (7.5 mg total) by mouth at bedtime.  . Multiple Vitamins-Minerals (PRESERVISION AREDS 2 PO) Take 1 tablet by mouth 2 (two) times daily.   . Omega-3 Fatty Acids (FISH OIL) 1000 MG CAPS Take 1,000 mg by mouth 2 (two) times daily. Reported on 10/10/2015  . omeprazole (PRILOSEC) 20 MG capsule Take 20 mg by mouth daily.    . polyethylene  glycol (MIRALAX / GLYCOLAX) packet Take 17 g by mouth daily.   . predniSONE (DELTASONE) 10 MG tablet Take 1 tablet (10 mg total) by mouth daily with breakfast.  . umeclidinium-vilanterol (ANORO ELLIPTA) 62.5-25 MCG/INH AEPB Inhale 1 puff into the lungs daily.   No facility-administered encounter medications on file as of 04/23/2016.     Allergies:  Crestor; Lipitor; and Zocor  Review of Systems: Gen:  Denies  fever, sweats. HEENT: Denies blurred vision. Cvc:  No dizziness, chest pain or heaviness Resp:   Denies cough or sputum porduction. Gi: Denies swallowing difficulty, stomach pain. constipation, bowel incontinence Gu:  Denies bladder incontinence, burning urine Ext:   No Joint pain, stiffness. Skin: No skin rash, easy bruising. Endoc:  No polyuria, polydipsia. Psych: No depression, insomnia. Other:  All other systems were reviewed and found to be negative other than what is mentioned in the HPI.   Physical Examination:   VS: BP 122/70 mmHg  Pulse 89  Ht 5\' 11"  (1.803 m)  Wt 192 lb (87.091 kg)  BMI 26.79 kg/m2  SpO2 94%  General Appearance: No distress  Neuro:without focal findings,  speech normal,  HEENT: PERRLA, EOM intact. Pulmonary: normal breath sounds, No wheezing.   CardiovascularNormal S1,S2.  No m/r/g.   Abdomen: Benign, Soft, non-tender. Renal:  No costovertebral tenderness  GU:  Not performed at this time. Endoc: No evident thyromegaly, no signs of acromegaly. Skin:   warm, no rash. Extremities: normal, no cyanosis, clubbing.   LABORATORY PANEL:   CBC No results for input(s): WBC, HGB, HCT, PLT in the last 168 hours. ------------------------------------------------------------------------------------------------------------------  Chemistries  No results for input(s): NA, K, CL, CO2, GLUCOSE, BUN, CREATININE, CALCIUM, MG, AST, ALT, ALKPHOS, BILITOT in the last 168 hours.  Invalid input(s):  GFRCGP ------------------------------------------------------------------------------------------------------------------  Cardiac Enzymes No results for input(s): TROPONINI in the last 168 hours. ------------------------------------------------------------  RADIOLOGY:   No results found for this or any previous visit. Results for orders placed during the hospital encounter of 11/06/15  DG Chest 2 View   Narrative CLINICAL DATA:  Shortness of breath, quit smoking 35 years ago, denies chest pain  EXAM: CHEST  2 VIEW  COMPARISON:  09/25/2015  FINDINGS: There is bilateral chronic interstitial lung disease. There is no focal parenchymal opacity. There is no pleural effusion or pneumothorax. The heart and mediastinal contours are unremarkable.  There is an ununited right posterior eighth rib fracture. There is a healing right posterolateral seventh rib fracture.  IMPRESSION: No active cardiopulmonary disease.   Electronically Signed   By: Elige KoHetal  Patel   On: 11/06/2015 11:49    ------------------------------------------------------------------------------------------------------------------  Thank  you for allowing [facility] to assist in the care of your patient. Our recommendations are noted above.  Please contact us if we can be of further service.   Wells Guileseep Bernard Slayden, MD.  [facility] Office Number: [phone]  Santiago Gladavid Kasa, M.D.  Stephanie AcreVishal Mungal, M.D.  Billy Fischeravid Simonds, M.D  04/23/2016

## 2016-04-24 ENCOUNTER — Non-Acute Institutional Stay (SKILLED_NURSING_FACILITY): Payer: Medicare Other | Admitting: Gerontology

## 2016-04-24 DIAGNOSIS — J9601 Acute respiratory failure with hypoxia: Secondary | ICD-10-CM | POA: Diagnosis not present

## 2016-04-24 NOTE — Progress Notes (Signed)
Location:  [facility]   Place of Service:  SNF [phone](31) Provider:  Lorenso QuarryShannon Kym Fenter, NP-C  No primary care provider on file.  No care team member to display  Extended Emergency Contact Information Primary Emergency Contact: Lovett Soxrnold,Teresa J Address: [id number]          [place], [place] Home Phone: [phone] Mobile Phone: [phone] Relation: Daughter Secondary Emergency Contact: Allen Kellampbell,Janice  [place] Home Phone: [phone] Relation: Daughter  Code Status:  DNR Goals of care: Advanced Directive information Advanced Directives 11/06/2015  Does patient have an advance directive? Yes  Type of Advance Directive Living will  Does patient want to make changes to advanced directive? -  Copy of advanced directive(s) in chart? No - copy requested     Chief Complaint  Patient presents with  . Shortness of Breath    HPI:  Pt is a 82 y.o. male seen today for an acute visit for respiratory distress- called urgently to the pt's room by nursing. Pt reports dyspnea started last night, but progressively has gotten worse. This morning, dyspnea developed into a crisis. O2 sats 82%. Pt reports "this is the worst it's ever gotten." Res reports vague, mid-sternal chest pain but says it's "not enough to worry about." Pt had difficulty tolerating nebulizer mask with strap d/t feeling chlosterphobic. Res also reports recent drastic worsening of symptoms. No longer able to walk more than 5 feet without becoming symptomatic.    Past Medical History  Diagnosis Date  . Hyperlipidemia   . Hypertension   . Coronary artery disease   . Arthritis   . Anemia, iron deficiency 05/17/2012   Past Surgical History  Procedure Laterality Date  . Other surgical history      tonsilectomy  . Circumcision  1968  . Cardiac catheterization  05/05/2000    stents  . Cardiac catheterization  01/13/2000    stents and ptca  . Joint  replacement Left     knee  . Tonsillectomy  1935    Allergies  Allergen Reactions  . Crestor [Rosuvastatin Calcium]     myalgias  . Lipitor [Atorvastatin Calcium]     myalgias  . Zocor [Simvastatin - High Dose]     myalgias      Medication List       This list is accurate as of: 04/24/16  9:46 PM.  Always use your most recent med list.               albuterol 108 (90 Base) MCG/ACT inhaler  Commonly known as:  PROVENTIL HFA;VENTOLIN HFA  Inhale 2 puffs into the lungs every 6 (six) hours as needed for wheezing or shortness of breath.     aspirin EC 81 MG tablet  Take 81 mg by mouth daily. Reported on 02/05/2016     ezetimibe 10 MG tablet  Commonly known as:  ZETIA  Take 1 tablet (10 mg total) by mouth daily.     fenofibrate 145 MG tablet  Commonly known as:  TRICOR  Take 145 mg by mouth daily.     Fish Oil 1000 MG Caps  Take 1,000 mg by mouth 2 (two) times daily. Reported on 10/10/2015     HYDROcodone-acetaminophen 5-325 MG tablet  Commonly known as:  NORCO  Take 0.5 tablets by mouth every 8 (eight) hours as needed for moderate pain.     ibuprofen 600 MG tablet  Commonly known as:  ADVIL,MOTRIN  TK 1  T PO Q 8 H PRN     ipratropium-albuterol 0.5-2.5 (3) MG/3ML Soln  Commonly known as:  DUONEB  Take 3 mLs by nebulization 4 (four) times daily. DX Code: J84.9     metoprolol succinate 50 MG 24 hr tablet  Commonly known as:  TOPROL-XL  Take 50 mg by mouth daily.     mirtazapine 7.5 MG tablet  Commonly known as:  REMERON  Take 1 tablet (7.5 mg total) by mouth at bedtime.     omeprazole 20 MG capsule  Commonly known as:  PRILOSEC  Take 20 mg by mouth daily.     polyethylene glycol packet  Commonly known as:  MIRALAX / GLYCOLAX  Take 17 g by mouth daily.     predniSONE 10 MG tablet  Commonly known as:  DELTASONE  Take 1 tablet (10 mg total) by mouth daily with breakfast.     PRESERVISION AREDS 2 PO  Take 1 tablet by mouth 2 (two) times daily.      traZODone 50 MG tablet  Commonly known as:  DESYREL     umeclidinium-vilanterol 62.5-25 MCG/INH Aepb  Commonly known as:  ANORO ELLIPTA  Inhale 1 puff into the lungs daily.        Review of Systems  Unable to perform ROS: Severe respiratory distress  Constitutional: Positive for diaphoresis, activity change and fatigue.  HENT: Negative.   Respiratory: Positive for cough, chest tightness, shortness of breath and wheezing.   Cardiovascular: Positive for chest pain. Negative for palpitations and leg swelling.  Gastrointestinal: Negative.   Genitourinary: Positive for difficulty urinating.  Musculoskeletal: Negative.   Skin: Positive for pallor.  Neurological: Negative.   Psychiatric/Behavioral: Negative.   All other systems reviewed and are negative.   Immunization History  Administered Date(s) Administered  . Influenza Split 07/23/2015  . Influenza-Unspecified 07/20/2013, 08/20/2014  . Pneumococcal Polysaccharide-23 11/07/2015   Pertinent  Health Maintenance Due  Topic Date Due  . INFLUENZA VACCINE  05/20/2016  . PNA vac Low Risk Adult (2 of 2 - PCV13) 11/06/2016   No flowsheet data found. Functional Status Survey:    There were no vitals filed for this visit. There is no weight on file to calculate BMI. Physical Exam  Constitutional: He is oriented to person, place, and time. He appears well-developed and well-nourished. He is active and cooperative. He appears distressed. Nasal cannula and face mask in place.  Eyes: Conjunctivae, EOM and lids are normal. Pupils are equal, round, and reactive to light.  Neck: No JVD present. Tracheal tenderness present. No tracheal deviation present. Thyroid mass present.  Cardiovascular: Normal heart sounds and normal pulses.  An irregular rhythm present. Tachycardia present.  Exam reveals no gallop, no distant heart sounds and no friction rub.   No murmur heard. Heart rate of 130  Pulmonary/Chest: Accessory muscle usage present. No  stridor. Tachypnea noted. He is in respiratory distress. He has wheezes (all fields). He has rhonchi (all fields). He has rales (all fields).  Resp rate > 40 br per minute, O2 sats 82% at onset; Duoneb x 2 & Albuterol neb administered before sats increased to 86%  Abdominal: Normal appearance. Bowel sounds are decreased. There is no tenderness.  Musculoskeletal:  Generalized weakness  Neurological: He is alert and oriented to person, place, and time. He is not disoriented.  Skin: Skin is warm and intact. He is diaphoretic. No cyanosis. There is pallor. Nails show no clubbing.  Psychiatric: His speech is normal and behavior is normal. Judgment  and thought content normal. His mood appears anxious. Cognition and memory are normal.  Nursing note and vitals reviewed.   Labs reviewed:  Recent Labs  11/06/15 1040 11/07/15 0510 03/20/16 0932  NA 140 140 135  K 4.3 4.4 4.1  CL 105 109 98*  CO2 25 25 28   GLUCOSE 100* 119* 165*  BUN 63* 51* 36*  CREATININE 1.53* 1.36* 1.20  CALCIUM 9.5 9.3 8.9    Recent Labs  03/20/16 0932  AST 21  ALT 29  ALKPHOS 62  BILITOT 0.4  PROT 6.4*  ALBUMIN 3.8    Recent Labs  11/06/15 1040 11/07/15 0510 03/20/16 0932  WBC 4.2 4.0 3.4*  NEUTROABS  --   --  2.4  HGB 11.9* 11.3* 10.4*  HCT 36.7* 35.0* 32.8*  MCV 80.9 82.0 74.5*  PLT 268 246 161   Lab Results  Component Value Date   TSH 4.126 11/06/2015   No results found for: HGBA1C Lab Results  Component Value Date   CHOL 140 11/07/2015   HDL 22* 11/07/2015   LDLCALC 91 11/07/2015   TRIG 133 11/07/2015   CHOLHDL 6.4 11/07/2015    Significant Diagnostic Results in last 30 days:  No results found.  Assessment/Plan 1. Acute respiratory failure with hypoxia (HCC)  Duonebs Q 4 hours prn  Albuterol nebs Q 2 hours prn  Lasix 40 mg IM x 1 now  Morphine 20 mg/ ml- 0.25-0.5 ml po/sl Q 1 hour prn, dyspnea, pain  Bladder scan Q 6 hours. I&O cath if residual > 250 mL May d/c order when  no I&O done for 48 hours.  Hospice referral for EOL care, Sx management  Titrate O2 to maintain sats >90%  Family/ staff Communication:  Total Time: 60 minutes  Documentation: 15 minutes  Face to Face: 45 minutes  Family/Phone: Updated Palliative Care NP   Labs/tests ordered:  none  Brynda RimShannon H. Azekiel Cremer, NP-C Geriatrics [facility] [address] [place], [place] [address] Cell Phone (Mon-Fri 8am-5pm):  [phone] On Call:  [phone] & follow prompts after 5pm & weekends Office Phone:  [phone] Office Fax:  [phone]

## 2016-05-20 ENCOUNTER — Encounter
Admission: RE | Admit: 2016-05-20 | Discharge: 2016-05-20 | Disposition: A | Discharge status: Home / Self Care | Source: Ambulatory Visit | Attending: Internal Medicine | Admitting: Internal Medicine

## 2016-05-20 DIAGNOSIS — R609 Edema, unspecified: Secondary | ICD-10-CM | POA: Insufficient documentation

## 2016-05-26 ENCOUNTER — Non-Acute Institutional Stay: Payer: Medicare Other | Admitting: Gerontology

## 2016-05-26 DIAGNOSIS — R609 Edema, unspecified: Secondary | ICD-10-CM

## 2016-05-26 DIAGNOSIS — R0602 Shortness of breath: Secondary | ICD-10-CM

## 2016-05-26 NOTE — Progress Notes (Signed)
Location:      Place of Service:  ALF (13) Provider:  Toni Arthurs, NP-C  No primary care provider on file.  No care team member to display  Extended Emergency Contact Information Primary Emergency Contact: Torien, Ramroop Address: [address], [place] Phone: [phone] Mobile Phone: [phone] Relation: Daughter Secondary Emergency Contact: Hester M[place] Phone: [phone] Relation: Daughter  Code Status:  DNR Goals of care: Advanced Directive information Advanced Directives 11/06/2015  Does patient have an advance directive? Yes  Type of Advance Directive Living will  Does patient want to make changes to advanced directive? -  Copy of advanced directive(s) in chart? No - copy requested     Chief Complaint  Patient presents with  . Acute Visit    HPI:  Pt is a 82 y.o. male seen today for an acute visit for dyspnea, confusion. Res did not remember being confused and scared this morning. Pt reports he has minimal pain, moderate dyspnea. He does report being concerned about the "panic breathing." He reports "not having the willpower" to do things anymore. Res does report that he slept well in his bed last night.   Past Medical History:  Diagnosis Date  . Anemia, iron deficiency 05/17/2012  . Arthritis   . Coronary artery disease   . Hyperlipidemia   . Hypertension    Past Surgical History:  Procedure Laterality Date  . CARDIAC CATHETERIZATION  05/05/2000   stents  . CARDIAC CATHETERIZATION  01/13/2000   stents and ptca  . CIRCUMCISION  1968  . JOINT REPLACEMENT Left    knee  . OTHER SURGICAL HISTORY     tonsilectomy  . TONSILLECTOMY  1935    Allergies  Allergen Reactions  . Crestor [Rosuvastatin Calcium]     myalgias  . Lipitor [Atorvastatin Calcium]     myalgias  . Zocor [Simvastatin - High Dose]     myalgias      Medication List       Accurate  as of 05/26/16  4:40 PM. Always use your most recent med list.          albuterol 108 (90 Base) MCG/ACT inhaler Commonly known as:  PROVENTIL HFA;VENTOLIN HFA Inhale 2 puffs into the lungs every 6 (six) hours as needed for wheezing or shortness of breath.   aspirin EC 81 MG tablet Take 81 mg by mouth daily. Reported on 02/05/2016   ezetimibe 10 MG tablet Commonly known as:  ZETIA Take 1 tablet (10 mg total) by mouth daily.   fenofibrate 145 MG tablet Commonly known as:  TRICOR Take 145 mg by mouth daily.   Fish Oil 1000 MG Caps Take 1,000 mg by mouth 2 (two) times daily. Reported on 10/10/2015   HYDROcodone-acetaminophen 5-325 MG tablet Commonly known as:  NORCO Take 0.5 tablets by mouth every 8 (eight) hours as needed for moderate pain.   ibuprofen 600 MG tablet Commonly known as:  ADVIL,MOTRIN TK 1 T PO Q 8 H PRN   ipratropium-albuterol 0.5-2.5 (3) MG/3ML Soln Commonly known as:  DUONEB Take 3 mLs by nebulization 4 (four) times daily. DX Code: J84.9   metoprolol succinate 50 MG 24 hr tablet Commonly known as:  TOPROL-XL Take 50 mg by mouth daily.   mirtazapine 7.5 MG tablet Commonly known as:  REMERON Take 1 tablet (7.5 mg total) by mouth at bedtime.   omeprazole 20  MG capsule Commonly known as:  PRILOSEC Take 20 mg by mouth daily.   polyethylene glycol packet Commonly known as:  MIRALAX / GLYCOLAX Take 17 g by mouth daily.   predniSONE 10 MG tablet Commonly known as:  DELTASONE Take 1 tablet (10 mg total) by mouth daily with breakfast.   PRESERVISION AREDS 2 PO Take 1 tablet by mouth 2 (two) times daily.   traZODone 50 MG tablet Commonly known as:  DESYREL   umeclidinium-vilanterol 62.5-25 MCG/INH Aepb Commonly known as:  ANORO ELLIPTA Inhale 1 puff into the lungs daily.       Review of Systems  Constitutional: Positive for fatigue. Negative for activity change, appetite change, chills, diaphoresis and unexpected weight change.  HENT: Negative for  congestion, sore throat, trouble swallowing and voice change.   Respiratory: Positive for cough, shortness of breath and wheezing. Negative for apnea, choking, chest tightness and stridor.   Cardiovascular: Positive for leg swelling. Negative for chest pain and palpitations.  Gastrointestinal: Negative for abdominal distention.  Musculoskeletal: Negative for arthralgias, back pain and joint swelling.  Neurological: Negative.   Psychiatric/Behavioral: Negative.   All other systems reviewed and are negative.   Immunization History  Administered Date(s) Administered  . Influenza Split 07/23/2015  . Influenza-Unspecified 07/20/2013, 08/20/2014  . Pneumococcal Polysaccharide-23 11/07/2015   Pertinent  Health Maintenance Due  Topic Date Due  . INFLUENZA VACCINE  05/20/2016  . PNA vac Low Risk Adult (2 of 2 - PCV13) 11/06/2016   No flowsheet data found. Functional Status Survey:    Vitals:   05/20/16 0500  BP: 112/71  Pulse: 90  Resp: 20  Temp: 97.4 F (36.3 C)  SpO2: 97%   There is no height or weight on file to calculate BMI. Physical Exam  Constitutional: He is oriented to person, place, and time. Vital signs are normal. He appears well-developed and well-nourished. He is active and cooperative. He does not appear ill. No distress.  HENT:  Head: Normocephalic and atraumatic.  Mouth/Throat: Uvula is midline, oropharynx is clear and moist and mucous membranes are normal. Mucous membranes are not pale, not dry and not cyanotic.  Eyes: Conjunctivae, EOM and lids are normal. Pupils are equal, round, and reactive to light.  Neck: Trachea normal, normal range of motion and full passive range of motion without pain. Neck supple. No JVD present. No tracheal deviation, no edema and no erythema present. No thyromegaly present.  Cardiovascular: Normal rate, normal heart sounds, intact distal pulses and normal pulses.  An irregular rhythm present. Exam reveals no gallop, no distant heart  sounds and no friction rub.   No murmur heard. Pulmonary/Chest: Effort normal. No accessory muscle usage. No respiratory distress. He has decreased breath sounds in the right lower field and the left lower field. He has no wheezes. He has no rhonchi. He has no rales. He exhibits no tenderness.  Abdominal: Soft. Normal appearance and bowel sounds are normal. He exhibits no distension and no ascites. There is no tenderness.  Musculoskeletal: Normal range of motion. He exhibits no edema or tenderness.  Expected osteoarthritis, stiffness  Neurological: He is alert and oriented to person, place, and time. He has normal strength.  Skin: Skin is warm, dry and intact. He is not diaphoretic. No cyanosis. No pallor. Nails show no clubbing.  Psychiatric: He has a normal mood and affect. His speech is normal and behavior is normal. Judgment and thought content normal. Cognition and memory are normal.  Nursing note and vitals reviewed.  Labs reviewed:  Recent Labs  11/06/15 1040 11/07/15 0510 03/20/16 0932  NA 140 140 135  K 4.3 4.4 4.1  CL 105 109 98*  CO2 25 25 28   GLUCOSE 100* 119* 165*  BUN 63* 51* 36*  CREATININE 1.53* 1.36* 1.20  CALCIUM 9.5 9.3 8.9    Recent Labs  03/20/16 0932  AST 21  ALT 29  ALKPHOS 62  BILITOT 0.4  PROT 6.4*  ALBUMIN 3.8    Recent Labs  11/06/15 1040 11/07/15 0510 03/20/16 0932  WBC 4.2 4.0 3.4*  NEUTROABS  --   --  2.4  HGB 11.9* 11.3* 10.4*  HCT 36.7* 35.0* 32.8*  MCV 80.9 82.0 74.5*  PLT 268 246 161   Lab Results  Component Value Date   TSH 4.126 11/06/2015   No results found for: HGBA1C Lab Results  Component Value Date   CHOL 140 11/07/2015   HDL 22 (L) 11/07/2015   LDLCALC 91 11/07/2015   TRIG 133 11/07/2015   CHOLHDL 6.4 11/07/2015    Significant Diagnostic Results in last 30 days:  No results found.  Assessment/Plan 1. Shortness of breath Fentanyl patch 12 mcg- 1 patch Q 3 days for dyspnea, pain D/C scheduled morphine  8/8  2. Peripheral edema  spironalactone 25 mg tablet: 1/2 tablet (12.5 mg) po q day for edema  Met b 1 week to check K+ levels  Family/ staff Communication:   Total Time: 30 minutes  Documentation: 15 minutes  Face to Face: 15 minutes  Family/Phone:   Labs/tests ordered:  Met B 1 week  Medication list reviewed and assessed for continued appropriateness.  Vikki Ports, NP-C Geriatrics [facility] [phone], [place] [address] Cell Phone (Mon-Fri 8am-5pm):  [phone] On Call:  [phone] & follow prompts after 5pm & weekends Office Phone:  [phone] Office Fax:  [phone]

## 2016-05-28 ENCOUNTER — Telehealth: Payer: Self-pay | Admitting: Cardiovascular Disease

## 2016-05-28 NOTE — Telephone Encounter (Signed)
Attempted to schedule fu from recall. Patient not interested in fu with [facility].  Deleting recall.

## 2016-05-30 ENCOUNTER — Non-Acute Institutional Stay: Payer: Medicare Other | Admitting: Gerontology

## 2016-05-30 DIAGNOSIS — J9601 Acute respiratory failure with hypoxia: Secondary | ICD-10-CM | POA: Diagnosis not present

## 2016-05-30 NOTE — Progress Notes (Signed)
Location:      Place of Service:  ALF (13) Provider:  Lorenso Quarry, NP-C  No primary care provider on file.  No care team member to display  Extended Emergency Contact Information Primary Emergency Contact: Turki, Tapanes Address: [address]          [place], [place] Home Phone: [phone] Mobile Phone: [phone] Relation: Daughter Secondary Emergency Contact: Allen [place] Home Phone: [phone] Relation: Daughter  Code Status:  DNR Goals of care: Advanced Directive information Advanced Directives 11/06/2015  Does patient have an advance directive? Yes  Type of Advance Directive Living will  Does patient want to make changes to advanced directive? -  Copy of advanced directive(s) in chart? No - copy requested     Chief Complaint  Patient presents with  . Acute Visit    HPI:  Pt is a 82 y.o. male seen today for an acute visit for dyspnea, tachycardia. Was notified by Hospice RN this am as she was unable to do an immediate assessment. Pt was dyspneic, but having up to 10 second periods of apnea. He was alert and oriented, but drifted off to sleep easily. He is anxious, doesn't want to leave his family. He says he" knows what's coming and it needs to happen" but that he "is going to fight it." Res denied pain, denied chest pain or difficulty breathing.    Past Medical History:  Diagnosis Date  . Anemia, iron deficiency 05/17/2012  . Arthritis   . Coronary artery disease   . Hyperlipidemia   . Hypertension    Past Surgical History:  Procedure Laterality Date  . CARDIAC CATHETERIZATION  05/05/2000   stents  . CARDIAC CATHETERIZATION  01/13/2000   stents and ptca  . CIRCUMCISION  1968  . JOINT REPLACEMENT Left    knee  . OTHER SURGICAL HISTORY     tonsilectomy  . TONSILLECTOMY  1935    Allergies  Allergen Reactions  . Crestor [Rosuvastatin Calcium]     myalgias  . Lipitor  [Atorvastatin Calcium]     myalgias  . Zocor [Simvastatin - High Dose]     myalgias      Medication List       Accurate as of 05/30/16  6:48 PM. Always use your most recent med list.          albuterol 108 (90 Base) MCG/ACT inhaler Commonly known as:  PROVENTIL HFA;VENTOLIN HFA Inhale 2 puffs into the lungs every 6 (six) hours as needed for wheezing or shortness of breath.   aspirin EC 81 MG tablet Take 81 mg by mouth daily. Reported on 02/05/2016   ezetimibe 10 MG tablet Commonly known as:  ZETIA Take 1 tablet (10 mg total) by mouth daily.   fenofibrate 145 MG tablet Commonly known as:  TRICOR Take 145 mg by mouth daily.   Fish Oil 1000 MG Caps Take 1,000 mg by mouth 2 (two) times daily. Reported on 10/10/2015   HYDROcodone-acetaminophen 5-325 MG tablet Commonly known as:  NORCO Take 0.5 tablets by mouth every 8 (eight) hours as needed for moderate pain.   ibuprofen 600 MG tablet Commonly known as:  ADVIL,MOTRIN TK 1 T PO Q 8 H PRN   ipratropium-albuterol 0.5-2.5 (3) MG/3ML Soln Commonly known as:  DUONEB Take 3 mLs by nebulization 4 (four) times daily. DX Code: J84.9   metoprolol succinate 50 MG 24 hr tablet Commonly known as:  TOPROL-XL Take 50 mg  by mouth daily.   mirtazapine 7.5 MG tablet Commonly known as:  REMERON Take 1 tablet (7.5 mg total) by mouth at bedtime.   omeprazole 20 MG capsule Commonly known as:  PRILOSEC Take 20 mg by mouth daily.   polyethylene glycol packet Commonly known as:  MIRALAX / GLYCOLAX Take 17 g by mouth daily.   predniSONE 10 MG tablet Commonly known as:  DELTASONE Take 1 tablet (10 mg total) by mouth daily with breakfast.   PRESERVISION AREDS 2 PO Take 1 tablet by mouth 2 (two) times daily.   traZODone 50 MG tablet Commonly known as:  DESYREL   umeclidinium-vilanterol 62.5-25 MCG/INH Aepb Commonly known as:  ANORO ELLIPTA Inhale 1 puff into the lungs daily.       Review of Systems  Constitutional: Positive  for activity change, appetite change and fatigue. Negative for chills, diaphoresis and fever.  HENT: Negative for congestion, sneezing, sore throat, trouble swallowing and voice change.   Eyes: Negative for pain, redness and visual disturbance.  Respiratory: Positive for apnea. Negative for cough, choking, chest tightness, shortness of breath and wheezing.   Cardiovascular: Positive for leg swelling. Negative for chest pain and palpitations.  Gastrointestinal: Negative for abdominal distention, abdominal pain, constipation, diarrhea and nausea.  Genitourinary: Negative for difficulty urinating, dysuria, frequency and urgency.  Musculoskeletal: Negative for back pain, gait problem and myalgias. Arthralgias: typical arthritis.  Skin: Positive for color change (mottling plantar surface of feet, cyanosis of fingernail beds). Negative for pallor, rash and wound.  Neurological: Negative for dizziness, tremors, syncope, speech difficulty, weakness, numbness and headaches.  Psychiatric/Behavioral: Positive for dysphoric mood. Negative for agitation, behavioral problems, confusion, decreased concentration, hallucinations, self-injury, sleep disturbance and suicidal ideas. The patient is nervous/anxious. The patient is not hyperactive.   All other systems reviewed and are negative.   Immunization History  Administered Date(s) Administered  . Influenza Split 07/23/2015  . Influenza-Unspecified 07/20/2013, 08/20/2014  . Pneumococcal Polysaccharide-23 11/07/2015   Pertinent  Health Maintenance Due  Topic Date Due  . INFLUENZA VACCINE  05/20/2016  . PNA vac Low Risk Adult (2 of 2 - PCV13) 11/06/2016   No flowsheet data found. Functional Status Survey:    Vitals:   05/28/16 1847  BP: 124/60  Pulse: 77  Resp: (!) 22  Temp: (!) 96.4 F (35.8 C)  SpO2: 96%   There is no height or weight on file to calculate BMI. Physical Exam  Constitutional: He is oriented to person, place, and time. Vital  signs are normal. He appears well-developed and well-nourished. He is active and cooperative. He does not appear ill. He appears distressed.  HENT:  Head: Normocephalic and atraumatic.  Mouth/Throat: Uvula is midline, oropharynx is clear and moist and mucous membranes are normal. Mucous membranes are not pale, not dry and not cyanotic.  Eyes: Conjunctivae, EOM and lids are normal. Pupils are equal, round, and reactive to light.  Neck: Trachea normal, normal range of motion and full passive range of motion without pain. Neck supple. No JVD present. No tracheal deviation, no edema and no erythema present. No thyromegaly present.  Cardiovascular: Intact distal pulses and normal pulses.  An irregular rhythm present. Tachycardia present.  Exam reveals no gallop, no distant heart sounds and no friction rub.   No murmur heard. Pulmonary/Chest: Breath sounds normal. No accessory muscle usage. Apnea (10 second periods of apnea) and tachypnea (32 breaths/ minute) noted. He is in respiratory distress. He has no decreased breath sounds. He has no wheezes. He  has no rhonchi. He has no rales. He exhibits no tenderness.  Abdominal: Normal appearance and bowel sounds are normal. He exhibits no distension and no ascites. There is no tenderness.  Musculoskeletal: Normal range of motion. He exhibits no edema or tenderness.  Expected osteoarthritis, stiffness  Neurological: He is alert and oriented to person, place, and time. He has normal strength.  Skin: Skin is warm, dry and intact. He is not diaphoretic. There is cyanosis (nail beds, mottling plantar surface of feet). There is pallor. Nails show no clubbing.  Psychiatric: His speech is normal. Judgment and thought content normal. His mood appears anxious. His affect is blunt. He is withdrawn. Cognition and memory are normal.  Nursing note and vitals reviewed.   Labs reviewed:  Recent Labs  11/06/15 1040 11/07/15 0510 03/20/16 0932  NA 140 140 135  K 4.3  4.4 4.1  CL 105 109 98*  CO2 25 25 28   GLUCOSE 100* 119* 165*  BUN 63* 51* 36*  CREATININE 1.53* 1.36* 1.20  CALCIUM 9.5 9.3 8.9    Recent Labs  03/20/16 0932  AST 21  ALT 29  ALKPHOS 62  BILITOT 0.4  PROT 6.4*  ALBUMIN 3.8    Recent Labs  11/06/15 1040 11/07/15 0510 03/20/16 0932  WBC 4.2 4.0 3.4*  NEUTROABS  --   --  2.4  HGB 11.9* 11.3* 10.4*  HCT 36.7* 35.0* 32.8*  MCV 80.9 82.0 74.5*  PLT 268 246 161   Lab Results  Component Value Date   TSH 4.126 11/06/2015   No results found for: HGBA1C Lab Results  Component Value Date   CHOL 140 11/07/2015   HDL 22 (L) 11/07/2015   LDLCALC 91 11/07/2015   TRIG 133 11/07/2015   CHOLHDL 6.4 11/07/2015    Significant Diagnostic Results in last 30 days:  No results found.  Assessment/Plan 1. Acute respiratory failure with hypoxia (HCC)  Pt appears to be actively dying. Pt was anxious, tachypneic, tachycardic. Examined pt then sat with pt for extended period of time discussing his fears, his faith, his children, his wishes. He thanked me and the staff for his good care. He appeared more at ease at the end of our visit. Hospice RN arrived to continue care. Foley ordered and inserted for comfort and energy conservation at pt's request. Comfort medications in place.    Family/ staff Communication:   Total Time: 80 minutes  Documentation: 20 minutes  Face to Face: 60 minutes  Family/Phone:   Labs/tests ordered:  none  Medication list reviewed and assessed for continued appropriateness.  Brynda Rim, NP-C Geriatrics [facility] [phone] [address]. [address], [place] [address] Cell Phone (Mon-Fri 8am-5pm):  [phone] On Call:  [phone] & follow prompts after 5pm & weekends Office Phone:  [phone] Office Fax:  [phone]

## 2016-06-03 DIAGNOSIS — R609 Edema, unspecified: Secondary | ICD-10-CM | POA: Diagnosis not present

## 2016-06-03 LAB — COMPREHENSIVE METABOLIC PANEL
ALBUMIN: 3.6 g/dL (ref 3.5–5.0)
ALK PHOS: 86 U/L (ref 38–126)
ALT: 20 U/L (ref 17–63)
ANION GAP: 6 (ref 5–15)
AST: 20 U/L (ref 15–41)
BILIRUBIN TOTAL: 0.9 mg/dL (ref 0.3–1.2)
BUN: 43 mg/dL — AB (ref 6–20)
CALCIUM: 8.7 mg/dL — AB (ref 8.9–10.3)
CO2: 30 mmol/L (ref 22–32)
Chloride: 105 mmol/L (ref 101–111)
Creatinine, Ser: 1.16 mg/dL (ref 0.61–1.24)
GFR calc Af Amer: >60 mL/min (ref >60)
GFR, EST NON AFRICAN AMERICAN: 54 mL/min — AB (ref >60)
GLUCOSE: 101 mg/dL — AB (ref 65–99)
Potassium: 4.2 mmol/L (ref 3.5–5.1)
Sodium: 141 mmol/L (ref 135–145)
TOTAL PROTEIN: 6.5 g/dL (ref 6.5–8.1)

## 2016-06-20 ENCOUNTER — Encounter
Admission: RE | Admit: 2016-06-20 | Discharge: 2016-06-20 | Disposition: A | Discharge status: Home / Self Care | Source: Ambulatory Visit | Attending: Internal Medicine | Admitting: Internal Medicine

## 2016-06-20 DIAGNOSIS — R350 Frequency of micturition: Secondary | ICD-10-CM | POA: Insufficient documentation

## 2016-07-09 DIAGNOSIS — R350 Frequency of micturition: Secondary | ICD-10-CM | POA: Diagnosis present

## 2016-07-09 LAB — URINALYSIS COMPLETE WITH MICROSCOPIC (ARMC ONLY)
BACTERIA UA: NONE SEEN
Bilirubin Urine: NEGATIVE
Glucose, UA: NEGATIVE mg/dL
KETONES UR: NEGATIVE mg/dL
NITRITE: NEGATIVE
PH: 5 (ref 5.0–8.0)
PROTEIN: 100 mg/dL — AB
SPECIFIC GRAVITY, URINE: 1.023 (ref 1.005–1.030)

## 2016-07-11 ENCOUNTER — Non-Acute Institutional Stay: Payer: Medicare Other | Admitting: Gerontology

## 2016-07-11 DIAGNOSIS — N390 Urinary tract infection, site not specified: Secondary | ICD-10-CM | POA: Diagnosis not present

## 2016-07-11 LAB — URINE CULTURE: Culture: 20000 — AB

## 2016-07-15 NOTE — Progress Notes (Signed)
Location:      Place of Service:  ALF (13) Provider:  Lorenso Quarry, NP-C  No primary care provider on file.  No care team member to display  Extended Emergency Contact Information Primary Emergency Contact: Kristen, [place] Address: [address]          [place], [place] Home Phone: [phone] Mobile Phone: [phone] Relation: Daughter Secondary Emergency Contact: Allen [place] Home Phone: [phone] Relation: Daughter  Code Status:  DO NOT RESUSCITATE Goals of care: Advanced Directive information Advanced Directives 11/06/2015  Does patient have an advance directive? Yes  Type of Advance Directive Living will  Does patient want to make changes to advanced directive? -  Copy of advanced directive(s) in chart? No - copy requested     Chief Complaint  Patient presents with  . Acute Visit    HPI:  Pt is a 82 y.o. male seen today for an acute visit for a UTI. Patient has been having increased frequency and urgency. Patient has been having to get up multiple times during the night. He is currently on [facility] with a diagnosis of COPD and oxygen dependence. He gets fatigued easily and the increased frequency of urination has made him very tired. In order to conserve energy, patient asked that a Foley catheter be inserted. A UA and C&S was obtained. Culture showed minor growth of proteus mirabilis. However, since he is experiencing bothersome symptoms, I will treat the infection. Patient denies nausea, vomiting, diarrhea, fever, chills chest pain, abdominal pain, back pain. On Assessment, patient in general just look like you didn't feel good. Vital signs are stable, no other complaints. Patient understands that the amount of growth would normally not indicate treatment, but is grateful for being treated due to the symptoms he is experiencing.   Past Medical History:  Diagnosis Date  . Anemia, iron  deficiency 05/17/2012  . Arthritis   . Coronary artery disease   . Hyperlipidemia   . Hypertension    Past Surgical History:  Procedure Laterality Date  . CARDIAC CATHETERIZATION  05/05/2000   stents  . CARDIAC CATHETERIZATION  01/13/2000   stents and ptca  . CIRCUMCISION  1968  . JOINT REPLACEMENT Left    knee  . OTHER SURGICAL HISTORY     tonsilectomy  . TONSILLECTOMY  1935    Allergies  Allergen Reactions  . Crestor [Rosuvastatin Calcium]     myalgias  . Lipitor [Atorvastatin Calcium]     myalgias  . Zocor [Simvastatin - High Dose]     myalgias      Medication List       Accurate as of 07/11/16 11:59 PM. Always use your most recent med list.          albuterol 108 (90 Base) MCG/ACT inhaler Commonly known as:  PROVENTIL HFA;VENTOLIN HFA Inhale 2 puffs into the lungs every 6 (six) hours as needed for wheezing or shortness of breath.   aspirin EC 81 MG tablet Take 81 mg by mouth daily. Reported on 02/05/2016   ezetimibe 10 MG tablet Commonly known as:  ZETIA Take 1 tablet (10 mg total) by mouth daily.   fenofibrate 145 MG tablet Commonly known as:  TRICOR Take 145 mg by mouth daily.   Fish Oil 1000 MG Caps Take 1,000 mg by mouth 2 (two) times daily. Reported on 10/10/2015   HYDROcodone-acetaminophen 5-325 MG tablet Commonly known as:  NORCO Take 0.5 tablets by mouth every  8 (eight) hours as needed for moderate pain.   ibuprofen 600 MG tablet Commonly known as:  ADVIL,MOTRIN TK 1 T PO Q 8 H PRN   ipratropium-albuterol 0.5-2.5 (3) MG/3ML Soln Commonly known as:  DUONEB Take 3 mLs by nebulization 4 (four) times daily. DX Code: J84.9   metoprolol succinate 50 MG 24 hr tablet Commonly known as:  TOPROL-XL Take 50 mg by mouth daily.   mirtazapine 7.5 MG tablet Commonly known as:  REMERON Take 1 tablet (7.5 mg total) by mouth at bedtime.   omeprazole 20 MG capsule Commonly known as:  PRILOSEC Take 20 mg by mouth daily.   polyethylene glycol  packet Commonly known as:  MIRALAX / GLYCOLAX Take 17 g by mouth daily.   predniSONE 10 MG tablet Commonly known as:  DELTASONE Take 1 tablet (10 mg total) by mouth daily with breakfast.   PRESERVISION AREDS 2 PO Take 1 tablet by mouth 2 (two) times daily.   traZODone 50 MG tablet Commonly known as:  DESYREL   umeclidinium-vilanterol 62.5-25 MCG/INH Aepb Commonly known as:  ANORO ELLIPTA Inhale 1 puff into the lungs daily.       Review of Systems  Constitutional: Positive for activity change, appetite change and fatigue. Negative for chills, diaphoresis and fever.  HENT: Negative for congestion, sneezing, sore throat, trouble swallowing and voice change.   Eyes: Negative for pain, redness and visual disturbance.  Respiratory: Negative for apnea, cough, choking, chest tightness, shortness of breath and wheezing.   Cardiovascular: Negative for chest pain, palpitations and leg swelling.  Gastrointestinal: Negative for abdominal distention, abdominal pain, constipation, diarrhea and nausea.  Genitourinary: Positive for difficulty urinating and frequency. Negative for dysuria and urgency.  Musculoskeletal: Negative for back pain, gait problem and myalgias. Arthralgias: typical arthritis.  Skin: Negative for color change (mottling plantar surface of feet, cyanosis of fingernail beds), pallor, rash and wound.  Neurological: Negative for dizziness, tremors, syncope, speech difficulty, weakness, numbness and headaches.  Psychiatric/Behavioral: Positive for dysphoric mood. Negative for agitation, behavioral problems, confusion, decreased concentration, hallucinations, self-injury, sleep disturbance and suicidal ideas. The patient is nervous/anxious. The patient is not hyperactive.   All other systems reviewed and are negative.   Immunization History  Administered Date(s) Administered  . Influenza Split 07/23/2015  . Influenza-Unspecified 07/20/2013, 08/20/2014  . Pneumococcal  Polysaccharide-23 11/07/2015   Pertinent  Health Maintenance Due  Topic Date Due  . INFLUENZA VACCINE  05/20/2016  . PNA vac Low Risk Adult (2 of 2 - PCV13) 11/06/2016   No flowsheet data found. Functional Status Survey:    Vitals:   07/02/16 0500  BP: 111/68  Pulse: (!) 102  Resp: 18  Temp: 97.2 F (36.2 C)  SpO2: 99%  Weight: 181 lb 11.2 oz (82.4 kg)   Body mass index is 25.34 kg/m. Physical Exam  Constitutional: He is oriented to person, place, and time. Vital signs are normal. He appears well-developed and well-nourished. He is active and cooperative. He does not appear ill. No distress.  HENT:  Head: Normocephalic and atraumatic.  Mouth/Throat: Uvula is midline, oropharynx is clear and moist and mucous membranes are normal. Mucous membranes are not pale, not dry and not cyanotic.  Eyes: Conjunctivae, EOM and lids are normal. Pupils are equal, round, and reactive to light.  Neck: Trachea normal, normal range of motion and full passive range of motion without pain. Neck supple. No JVD present. No tracheal deviation, no edema and no erythema present. No thyromegaly present.  Cardiovascular: Intact  distal pulses and normal pulses.  An irregular rhythm present. Tachycardia present.  Exam reveals no gallop, no distant heart sounds and no friction rub.   No murmur heard. Pulmonary/Chest: Breath sounds normal. No accessory muscle usage. Apnea (10 second periods of apnea) and tachypnea (32 breaths/ minute) noted. No respiratory distress. He has no decreased breath sounds. He has no wheezes. He has no rhonchi. He has no rales. He exhibits no tenderness.  Abdominal: Normal appearance and bowel sounds are normal. He exhibits no distension and no ascites. There is no tenderness.  Musculoskeletal: Normal range of motion. He exhibits no edema or tenderness.  Expected osteoarthritis, stiffness  Neurological: He is alert and oriented to person, place, and time. He has normal strength.  Skin:  Skin is warm, dry and intact. He is not diaphoretic. No cyanosis (nail beds, mottling plantar surface of feet). There is pallor. Nails show no clubbing.  Psychiatric: His speech is normal. Judgment and thought content normal. His mood appears anxious. His affect is blunt. He is withdrawn. Cognition and memory are normal.  Nursing note and vitals reviewed.   Labs reviewed:  Recent Labs  11/07/15 0510 03/20/16 0932 06/03/16 0659  NA 140 135 141  K 4.4 4.1 4.2  CL 109 98* 105  CO2 25 28 30   GLUCOSE 119* 165* 101*  BUN 51* 36* 43*  CREATININE 1.36* 1.20 1.16  CALCIUM 9.3 8.9 8.7*    Recent Labs  03/20/16 0932 06/03/16 0659  AST 21 20  ALT 29 20  ALKPHOS 62 86  BILITOT 0.4 0.9  PROT 6.4* 6.5  ALBUMIN 3.8 3.6    Recent Labs  11/06/15 1040 11/07/15 0510 03/20/16 0932  WBC 4.2 4.0 3.4*  NEUTROABS  --   --  2.4  HGB 11.9* 11.3* 10.4*  HCT 36.7* 35.0* 32.8*  MCV 80.9 82.0 74.5*  PLT 268 246 161   Lab Results  Component Value Date   TSH 4.126 11/06/2015   No results found for: HGBA1C Lab Results  Component Value Date   CHOL 140 11/07/2015   HDL 22 (L) 11/07/2015   LDLCALC 91 11/07/2015   TRIG 133 11/07/2015   CHOLHDL 6.4 11/07/2015    Significant Diagnostic Results in last 30 days:  No results found.  Assessment/Plan 1. Urinary tract infection, site not specified   Cipro 250 mg PO Q 12 hours x 14 days for proteus mirabilis.   Insert and maintain Foley catheter for energy conservation related to symptoms.   May remove Foley at patient's request.  Family/ staff Communication:   Total Time:  Documentation:  Face to Face:  Family/Phone:   Labs/tests ordered:  UA, C&S  Medication list reviewed and assessed for continued appropriateness.  Brynda RimShannon H. Alessa Mazur, NP-C Geriatrics [facility] [phone] [address] [place], [place] [address] Cell Phone (Mon-Fri 8am-5pm):  [phone] On Call:  [phone] & follow prompts  after 5pm & weekends Office Phone:  [phone] Office Fax:  [phone]

## 2016-07-20 ENCOUNTER — Encounter
Admission: RE | Admit: 2016-07-20 | Discharge: 2016-07-20 | Disposition: A | Discharge status: Home / Self Care | Source: Ambulatory Visit | Attending: Internal Medicine | Admitting: Internal Medicine

## 2016-07-20 DIAGNOSIS — R410 Disorientation, unspecified: Secondary | ICD-10-CM | POA: Insufficient documentation

## 2016-08-02 DIAGNOSIS — R410 Disorientation, unspecified: Secondary | ICD-10-CM | POA: Diagnosis not present

## 2016-08-02 LAB — URINALYSIS COMPLETE WITH MICROSCOPIC (ARMC ONLY)
Bacteria, UA: NONE SEEN
Bilirubin Urine: NEGATIVE
GLUCOSE, UA: NEGATIVE mg/dL
Ketones, ur: NEGATIVE mg/dL
Nitrite: NEGATIVE
Protein, ur: 100 mg/dL — AB
Specific Gravity, Urine: 1.023 (ref 1.005–1.030)
pH: 5 (ref 5.0–8.0)

## 2016-08-04 ENCOUNTER — Non-Acute Institutional Stay: Payer: Medicare Other | Admitting: Gerontology

## 2016-08-04 DIAGNOSIS — T83511A Infection and inflammatory reaction due to indwelling urethral catheter, initial encounter: Secondary | ICD-10-CM

## 2016-08-04 DIAGNOSIS — N390 Urinary tract infection, site not specified: Secondary | ICD-10-CM

## 2016-08-04 LAB — URINE CULTURE

## 2016-08-05 ENCOUNTER — Non-Acute Institutional Stay: Payer: Self-pay | Admitting: Gerontology

## 2016-08-05 ENCOUNTER — Non-Acute Institutional Stay: Payer: Medicare Other | Admitting: Gerontology

## 2016-08-05 DIAGNOSIS — N390 Urinary tract infection, site not specified: Secondary | ICD-10-CM

## 2016-08-05 DIAGNOSIS — T83511D Infection and inflammatory reaction due to indwelling urethral catheter, subsequent encounter: Principal | ICD-10-CM

## 2016-08-20 ENCOUNTER — Encounter: Admission: RE | Admit: 2016-08-20 | Payer: Medicare Other | Source: Ambulatory Visit | Admitting: Internal Medicine

## 2016-08-20 DIAGNOSIS — 419620001 Death: Secondary | SNOMED CT | POA: Diagnosis not present

## 2016-08-20 NOTE — Progress Notes (Signed)
Location:      Place of Service:  ALF (13) Provider:  Lorenso Quarry, NP-C  No primary care provider on file.  No care team member to display  Extended Emergency Contact Information Primary Emergency Contact: Hussien, Greenblatt Address: [address]          [place], [place] Home Phone: [phone] Mobile Phone: [phone] Relation: Daughter Secondary Emergency Contact: Allen Kell [place] Home Phone: [phone] Relation: Daughter  Code Status:  DO NOT RESUSCITATE Goals of care: Advanced Directive information Advanced Directives 11/06/2015  Does patient have an advance directive? Yes  Type of Advance Directive Living will  Does patient want to make changes to advanced directive? -  Copy of advanced directive(s) in chart? No - copy requested     Chief Complaint  Patient presents with  . Follow-up    HPI:  Pt is a 82 y.o. male seen today for a  f/u s/p Diagnosis of urinary tract infection related to coagulase-negative Staphylococcus aureus. Bacteria is sensitive to Macrobid and resistant to typical first-line medications. Patient was started on Macrobid yesterday evening. It was reported patient had to falls during the night. Patient denies increased (self-reported) confusion, abdominal pain, dyspnea. Patient stated he "just felt weary." No other complaints at this time.  Past Medical History:  Diagnosis Date  . Anemia, iron deficiency 05/17/2012  . Arthritis   . Coronary artery disease   . Hyperlipidemia   . Hypertension    Past Surgical History:  Procedure Laterality Date  . CARDIAC CATHETERIZATION  05/05/2000   stents  . CARDIAC CATHETERIZATION  01/13/2000   stents and ptca  . CIRCUMCISION  1968  . JOINT REPLACEMENT Left    knee  . OTHER SURGICAL HISTORY     tonsilectomy  . TONSILLECTOMY  1935    Allergies  Allergen Reactions  . Crestor [Rosuvastatin Calcium]     myalgias  . Lipitor  [Atorvastatin Calcium]     myalgias  . Zocor [Simvastatin - High Dose]     myalgias      Medication List       Accurate as of 2016/08/18  6:34 PM. Always use your most recent med list.          albuterol 108 (90 Base) MCG/ACT inhaler Commonly known as:  PROVENTIL HFA;VENTOLIN HFA Inhale 2 puffs into the lungs every 6 (six) hours as needed for wheezing or shortness of breath.   aspirin EC 81 MG tablet Take 81 mg by mouth daily. Reported on 02/05/2016   ezetimibe 10 MG tablet Commonly known as:  ZETIA Take 1 tablet (10 mg total) by mouth daily.   fenofibrate 145 MG tablet Commonly known as:  TRICOR Take 145 mg by mouth daily.   Fish Oil 1000 MG Caps Take 1,000 mg by mouth 2 (two) times daily. Reported on 10/10/2015   HYDROcodone-acetaminophen 5-325 MG tablet Commonly known as:  NORCO Take 0.5 tablets by mouth every 8 (eight) hours as needed for moderate pain.   ibuprofen 600 MG tablet Commonly known as:  ADVIL,MOTRIN TK 1 T PO Q 8 H PRN   ipratropium-albuterol 0.5-2.5 (3) MG/3ML Soln Commonly known as:  DUONEB Take 3 mLs by nebulization 4 (four) times daily. DX Code: J84.9   metoprolol succinate 50 MG 24 hr tablet Commonly known as:  TOPROL-XL Take 50 mg by mouth daily.   mirtazapine 7.5 MG tablet Commonly known as:  REMERON Take 1 tablet (7.5 mg total) by  mouth at bedtime.   omeprazole 20 MG capsule Commonly known as:  PRILOSEC Take 20 mg by mouth daily.   polyethylene glycol packet Commonly known as:  MIRALAX / GLYCOLAX Take 17 g by mouth daily.   predniSONE 10 MG tablet Commonly known as:  DELTASONE Take 1 tablet (10 mg total) by mouth daily with breakfast.   PRESERVISION AREDS 2 PO Take 1 tablet by mouth 2 (two) times daily.   traZODone 50 MG tablet Commonly known as:  DESYREL   umeclidinium-vilanterol 62.5-25 MCG/INH Aepb Commonly known as:  ANORO ELLIPTA Inhale 1 puff into the lungs daily.       Review of Systems  Constitutional:  Positive for activity change, appetite change and fatigue. Negative for chills, diaphoresis and fever.  Respiratory: Negative for apnea, cough, choking, chest tightness, shortness of breath and wheezing.   Cardiovascular: Negative for chest pain, palpitations and leg swelling.  Gastrointestinal: Negative for abdominal distention, abdominal pain, constipation, diarrhea and nausea.  Genitourinary: Positive for frequency. Negative for difficulty urinating, dysuria and urgency.  Musculoskeletal: Negative for back pain, gait problem and myalgias. Arthralgias: typical arthritis.  Skin: Negative for color change (mottling plantar surface of feet, cyanosis of fingernail beds), pallor, rash and wound.  Neurological: Negative for dizziness, tremors, syncope, speech difficulty, weakness, numbness and headaches.  Psychiatric/Behavioral: Negative for agitation, behavioral problems, confusion, decreased concentration, dysphoric mood, hallucinations, self-injury, sleep disturbance and suicidal ideas. The patient is not nervous/anxious and is not hyperactive.   All other systems reviewed and are negative.   Immunization History  Administered Date(s) Administered  . Influenza Split 07/23/2015  . Influenza-Unspecified 07/20/2013, 08/20/2014  . Pneumococcal Polysaccharide-23 11/07/2015   Pertinent  Health Maintenance Due  Topic Date Due  . INFLUENZA VACCINE  05/20/2016  . PNA vac Low Risk Adult (2 of 2 - PCV13) 11/06/2016   No flowsheet data found. Functional Status Survey:    Vitals:   08/04/16 0600  BP: (!) 143/95  Pulse: (!) 103  Temp: 98.1 F (36.7 C)  SpO2: 96%   There is no height or weight on file to calculate BMI. Physical Exam  Constitutional: He is oriented to person, place, and time. Vital signs are normal. He appears well-developed and well-nourished. He is active and cooperative. He does not appear ill. No distress. Nasal cannula in place.  HENT:  Head: Normocephalic and atraumatic.    Mouth/Throat: Uvula is midline, oropharynx is clear and moist and mucous membranes are normal. Mucous membranes are not pale, not dry and not cyanotic.  Eyes: Conjunctivae, EOM and lids are normal. Pupils are equal, round, and reactive to light.  Neck: Trachea normal, normal range of motion and full passive range of motion without pain. Neck supple. No JVD present. No tracheal deviation, no edema and no erythema present. No thyromegaly present.  Cardiovascular: Intact distal pulses and normal pulses.  An irregular rhythm present. Tachycardia present.  Exam reveals no gallop, no distant heart sounds and no friction rub.   No murmur heard. Pulmonary/Chest: Breath sounds normal. No accessory muscle usage. Tachypnea noted. No apnea (10 second periods of apnea). No respiratory distress. He has no decreased breath sounds. He has no wheezes. He has no rhonchi. He has no rales. He exhibits no tenderness.  Abdominal: Normal appearance and bowel sounds are normal. He exhibits no distension and no ascites. There is no tenderness.  Genitourinary:  Genitourinary Comments: Foley in place  Musculoskeletal: Normal range of motion. He exhibits no edema or tenderness.  Expected osteoarthritis,  stiffness  Neurological: He is alert and oriented to person, place, and time. He has normal strength.  Skin: Skin is warm, dry and intact. He is not diaphoretic. No cyanosis (nail beds, mottling plantar surface of feet). There is pallor. Nails show no clubbing.  Psychiatric: His speech is normal. Judgment and thought content normal. His affect is blunt. He is withdrawn. Cognition and memory are normal.  Patient appears tired/fatigued  Nursing note and vitals reviewed.   Labs reviewed:  Recent Labs  11/07/15 0510 03/20/16 0932 06/03/16 0659  NA 140 135 141  K 4.4 4.1 4.2  CL 109 98* 105  CO2 25 28 30   GLUCOSE 119* 165* 101*  BUN 51* 36* 43*  CREATININE 1.36* 1.20 1.16  CALCIUM 9.3 8.9 8.7*    Recent Labs   03/20/16 0932 06/03/16 0659  AST 21 20  ALT 29 20  ALKPHOS 62 86  BILITOT 0.4 0.9  PROT 6.4* 6.5  ALBUMIN 3.8 3.6    Recent Labs  11/06/15 1040 11/07/15 0510 03/20/16 0932  WBC 4.2 4.0 3.4*  NEUTROABS  --   --  2.4  HGB 11.9* 11.3* 10.4*  HCT 36.7* 35.0* 32.8*  MCV 80.9 82.0 74.5*  PLT 268 246 161   Lab Results  Component Value Date   TSH 4.126 11/06/2015   No results found for: HGBA1C Lab Results  Component Value Date   CHOL 140 11/07/2015   HDL 22 (L) 11/07/2015   LDLCALC 91 11/07/2015   TRIG 133 11/07/2015   CHOLHDL 6.4 11/07/2015    Significant Diagnostic Results in last 30 days:  No results found.  Assessment/Plan 1. Urinary tract infection associated with indwelling urethral catheter, subsequent encounter  Continue Macrobid 100 mg by mouth every 12 hours 14 days  Change Foley catheter monthly  Thorough peri-Care     Family/ staff Communication:   Total Time:  Documentation:  Face to Face:  Family/Phone:   Labs/tests ordered:    Brynda RimShannon H. Kai Calico, NP-C Geriatrics [facility] [address] [place], [place] [address] Cell Phone (Mon-Fri 8am-5pm):  [phone] On Call:  [phone] & follow prompts after 5pm & weekends Office Phone:  [phone] Office Fax:  [phone]

## 2016-08-20 NOTE — Progress Notes (Signed)
Location:      Place of Service:  ALF (13) Provider:  Lorenso Quarry, NP-C  No primary care provider on file.  No care team member to display  Extended Emergency Contact Information Primary Emergency Contact: Jomo, Forand Address: [address]          [place], [place] Home Phone: [phone] Mobile Phone: [phone] Relation: Daughter Secondary Emergency Contact: Allen Kell [place] Home Phone: [phone] Relation: Daughter  Code Status:  DO NOT RESUSCITATE Goals of care: Advanced Directive information Advanced Directives 11/06/2015  Does patient have an advance directive? Yes  Type of Advance Directive Living will  Does patient want to make changes to advanced directive? -  Copy of advanced directive(s) in chart? No - copy requested     Chief Complaint  Patient presents with  . Acute Visit    HPI:  Pt is a 82 y.o. male seen today for an acute visit for Pronouncement of death. Called urgently to room by medication technicians on assisted living. Patient was returning from the bathroom with assistance of the CNA and became unresponsive. CNA eased patient to the floor and called for assistance. Upon entering the room, patient was ashen colored, without respirations, without heartbeat. EMS had been called by staff member unaware of hospice status. EMS arrived and placed patient on the cardiac monitor. Monitor shows asystole. Time of death 66. Hospice notified.   Past Medical History:  Diagnosis Date  . Anemia, iron deficiency 05/17/2012  . Arthritis   . Coronary artery disease   . Hyperlipidemia   . Hypertension    Past Surgical History:  Procedure Laterality Date  . CARDIAC CATHETERIZATION  05/05/2000   stents  . CARDIAC CATHETERIZATION  01/13/2000   stents and ptca  . CIRCUMCISION  1968  . JOINT REPLACEMENT Left    knee  . OTHER SURGICAL HISTORY     tonsilectomy  . TONSILLECTOMY  1935     Allergies  Allergen Reactions  . Crestor [Rosuvastatin Calcium]     myalgias  . Lipitor [Atorvastatin Calcium]     myalgias  . Zocor [Simvastatin - High Dose]     myalgias      Medication List       Accurate as of 08-27-16  6:56 PM. Always use your most recent med list.          albuterol 108 (90 Base) MCG/ACT inhaler Commonly known as:  PROVENTIL HFA;VENTOLIN HFA Inhale 2 puffs into the lungs every 6 (six) hours as needed for wheezing or shortness of breath.   aspirin EC 81 MG tablet Take 81 mg by mouth daily. Reported on 02/05/2016   ezetimibe 10 MG tablet Commonly known as:  ZETIA Take 1 tablet (10 mg total) by mouth daily.   fenofibrate 145 MG tablet Commonly known as:  TRICOR Take 145 mg by mouth daily.   Fish Oil 1000 MG Caps Take 1,000 mg by mouth 2 (two) times daily. Reported on 10/10/2015   HYDROcodone-acetaminophen 5-325 MG tablet Commonly known as:  NORCO Take 0.5 tablets by mouth every 8 (eight) hours as needed for moderate pain.   ibuprofen 600 MG tablet Commonly known as:  ADVIL,MOTRIN TK 1 T PO Q 8 H PRN   ipratropium-albuterol 0.5-2.5 (3) MG/3ML Soln Commonly known as:  DUONEB Take 3 mLs by nebulization 4 (four) times daily. DX Code: J84.9   metoprolol succinate 50 MG 24 hr tablet Commonly known as:  TOPROL-XL Take 50  mg by mouth daily.   mirtazapine 7.5 MG tablet Commonly known as:  REMERON Take 1 tablet (7.5 mg total) by mouth at bedtime.   omeprazole 20 MG capsule Commonly known as:  PRILOSEC Take 20 mg by mouth daily.   polyethylene glycol packet Commonly known as:  MIRALAX / GLYCOLAX Take 17 g by mouth daily.   predniSONE 10 MG tablet Commonly known as:  DELTASONE Take 1 tablet (10 mg total) by mouth daily with breakfast.   PRESERVISION AREDS 2 PO Take 1 tablet by mouth 2 (two) times daily.   traZODone 50 MG tablet Commonly known as:  DESYREL   umeclidinium-vilanterol 62.5-25 MCG/INH Aepb Commonly known as:  ANORO  ELLIPTA Inhale 1 puff into the lungs daily.       Review of Systems  Unable to perform ROS: Other  Respiratory: Positive for apnea.   Skin: Positive for pallor.    Immunization History  Administered Date(s) Administered  . Influenza Split 07/23/2015  . Influenza-Unspecified 07/20/2013, 08/20/2014  . Pneumococcal Polysaccharide-23 11/07/2015   Pertinent  Health Maintenance Due  Topic Date Due  . INFLUENZA VACCINE  05/20/2016  . PNA vac Low Risk Adult (2 of 2 - PCV13) 11/06/2016   No flowsheet data found. Functional Status Survey:    There were no vitals filed for this visit. There is no height or weight on file to calculate BMI. Physical Exam  Constitutional: Nasal cannula in place.  Cardiovascular:  Asystole  Pulmonary/Chest: Apnea noted.  Negative respirations  Neurological: He is unresponsive.  Skin: There is cyanosis.    Labs reviewed:  Recent Labs  11/07/15 0510 03/20/16 0932 06/03/16 0659  NA 140 135 141  K 4.4 4.1 4.2  CL 109 98* 105  CO2 25 28 30   GLUCOSE 119* 165* 101*  BUN 51* 36* 43*  CREATININE 1.36* 1.20 1.16  CALCIUM 9.3 8.9 8.7*    Recent Labs  03/20/16 0932 06/03/16 0659  AST 21 20  ALT 29 20  ALKPHOS 62 86  BILITOT 0.4 0.9  PROT 6.4* 6.5  ALBUMIN 3.8 3.6    Recent Labs  11/06/15 1040 11/07/15 0510 03/20/16 0932  WBC 4.2 4.0 3.4*  NEUTROABS  --   --  2.4  HGB 11.9* 11.3* 10.4*  HCT 36.7* 35.0* 32.8*  MCV 80.9 82.0 74.5*  PLT 268 246 161   Lab Results  Component Value Date   TSH 4.126 11/06/2015   No results found for: HGBA1C Lab Results  Component Value Date   CHOL 140 11/07/2015   HDL 22 (L) 11/07/2015   LDLCALC 91 11/07/2015   TRIG 133 11/07/2015   CHOLHDL 6.4 11/07/2015    Significant Diagnostic Results in last 30 days:  No results found.  Assessment/Plan 1. Death  Time of death 1335  Cause of death : Primary hospice diagnosis of pulmonary fibrosis ; secondary hospice diagnosis : Chronic O2  dependence, severe dyspnea, lung cancer   Okay to release body to funeral home of choice  Family/ staff Communication:   Total Time:  Documentation:  Face to Face:  Family/Phone:Notified hospice nurse of event. Hospice RN contacted daughter.   Labs/tests ordered:    Medication list reviewed and assessed for continued appropriateness.  Brynda RimShannon H. Tequila Rottmann, NP-C Geriatrics [facility] [phone] [address] [place], [place] [address] Cell Phone (Mon-Fri 8am-5pm):  [phone] On Call:  [phone] & follow prompts after 5pm & weekends Office Phone:  [phone] Office Fax:  [phone]

## 2016-08-20 NOTE — Progress Notes (Signed)
Location:      Place of Service:  ALF (13) Provider:  Lorenso QuarryShannon Shaquinta Peruski, NP-C  No primary care provider on file.  No care team member to display  Extended Emergency Contact Information Primary Emergency Contact: Lovett Soxrnold,Teresa J Address: [id number]          [place], [place] Home Phone: [phone] Mobile Phone: [phone] Relation: Daughter Secondary Emergency Contact: Allen Kellampbell,Janice  [place] Home Phone: [phone] Relation: Daughter  Code Status:  DO NOT RESUSCITATE Goals of care: Advanced Directive information Advanced Directives 11/06/2015  Does patient have an advance directive? Yes  Type of Advance Directive Living will  Does patient want to make changes to advanced directive? -  Copy of advanced directive(s) in chart? No - copy requested     Chief Complaint  Patient presents with  . Acute Visit    HPI:  Pt is a 82 y.o. male seen today for an acute visit for a UTI. Patient has been having increased Confusion with multiple falls. Patient has been having to get up multiple times during the night. [facility] with a diagnosis of COPD and oxygen dependence. He gets fatigued easily and the increased frequency of urination has made him very tired. In order to conserve energy, patient asked that a Foley catheter be inserted. Foley is intact and being maintained by hospice RN. Due to increased confusion, restlessness, A UA and C&S was obtained. Culture showed >100,000 colonies of coagulase-negative Staphylococcus aureus. Bacteria is resistant to several first-line medications, but sensitive to Macrobid Patient denies nausea, vomiting, diarrhea, fever, chills chest pain, abdominal pain, back pain. On Assessment, patient in general just looks like he doesn't feel good. Vital signs are stable, no other complaints.   Past Medical History:  Diagnosis Date  . Anemia, iron deficiency 05/17/2012  .  Arthritis   . Coronary artery disease   . Hyperlipidemia   . Hypertension    Past Surgical History:  Procedure Laterality Date  . CARDIAC CATHETERIZATION  05/05/2000   stents  . CARDIAC CATHETERIZATION  01/13/2000   stents and ptca  . CIRCUMCISION  1968  . JOINT REPLACEMENT Left    knee  . OTHER SURGICAL HISTORY     tonsilectomy  . TONSILLECTOMY  1935    Allergies  Allergen Reactions  . Crestor [Rosuvastatin Calcium]     myalgias  . Lipitor [Atorvastatin Calcium]     myalgias  . Zocor [Simvastatin - High Dose]     myalgias      Medication List       Accurate as of 08/04/16 11:59 PM. Always use your most recent med list.          albuterol 108 (90 Base) MCG/ACT inhaler Commonly known as:  PROVENTIL HFA;VENTOLIN HFA Inhale 2 puffs into the lungs every 6 (six) hours as needed for wheezing or shortness of breath.   aspirin EC 81 MG tablet Take 81 mg by mouth daily. Reported on 02/05/2016   ezetimibe 10 MG tablet Commonly known as:  ZETIA Take 1 tablet (10 mg total) by mouth daily.   fenofibrate 145 MG tablet Commonly known as:  TRICOR Take 145 mg by mouth daily.   Fish Oil 1000 MG Caps Take 1,000 mg by mouth 2 (two) times daily. Reported on 10/10/2015   HYDROcodone-acetaminophen 5-325 MG tablet Commonly known as:  NORCO Take 0.5 tablets by mouth every 8 (eight) hours as needed for moderate pain.  ibuprofen 600 MG tablet Commonly known as:  ADVIL,MOTRIN TK 1 T PO Q 8 H PRN   ipratropium-albuterol 0.5-2.5 (3) MG/3ML Soln Commonly known as:  DUONEB Take 3 mLs by nebulization 4 (four) times daily. DX Code: J84.9   metoprolol succinate 50 MG 24 hr tablet Commonly known as:  TOPROL-XL Take 50 mg by mouth daily.   mirtazapine 7.5 MG tablet Commonly known as:  REMERON Take 1 tablet (7.5 mg total) by mouth at bedtime.   omeprazole 20 MG capsule Commonly known as:  PRILOSEC Take 20 mg by mouth daily.   polyethylene glycol packet Commonly known as:   MIRALAX / GLYCOLAX Take 17 g by mouth daily.   predniSONE 10 MG tablet Commonly known as:  DELTASONE Take 1 tablet (10 mg total) by mouth daily with breakfast.   PRESERVISION AREDS 2 PO Take 1 tablet by mouth 2 (two) times daily.   traZODone 50 MG tablet Commonly known as:  DESYREL   umeclidinium-vilanterol 62.5-25 MCG/INH Aepb Commonly known as:  ANORO ELLIPTA Inhale 1 puff into the lungs daily.       Review of Systems  Unable to perform ROS: Other  Constitutional: Positive for activity change, appetite change and fatigue. Negative for chills, diaphoresis and fever.  Respiratory: Negative for apnea, cough, choking, chest tightness, shortness of breath and wheezing.   Cardiovascular: Negative for chest pain, palpitations and leg swelling.  Gastrointestinal: Negative for abdominal distention, abdominal pain, constipation, diarrhea and nausea.  Genitourinary: Negative for difficulty urinating, dysuria, frequency and urgency.  Musculoskeletal: Negative for back pain, gait problem and myalgias. Arthralgias: typical arthritis.  Skin: Positive for pallor. Negative for color change (mottling plantar surface of feet, cyanosis of fingernail beds), rash and wound.  Neurological: Negative for dizziness, tremors, syncope, speech difficulty, weakness, numbness and headaches.  Psychiatric/Behavioral: Positive for dysphoric mood. Negative for agitation, behavioral problems, confusion, decreased concentration, hallucinations, self-injury, sleep disturbance and suicidal ideas. The patient is nervous/anxious. The patient is not hyperactive.   All other systems reviewed and are negative.   Immunization History  Administered Date(s) Administered  . Influenza Split 07/23/2015  . Influenza-Unspecified 07/20/2013, 08/20/2014  . Pneumococcal Polysaccharide-23 11/07/2015   Pertinent  Health Maintenance Due  Topic Date Due  . INFLUENZA VACCINE  05/20/2016  . PNA vac Low Risk Adult (2 of 2 - PCV13)  11/06/2016   No flowsheet data found. Functional Status Survey:    Vitals:   08/04/16 0600  BP: (!) 143/95  Pulse: (!) 103  Temp: 98.1 F (36.7 C)  SpO2: 96%   There is no height or weight on file to calculate BMI. Physical Exam  Constitutional: He is oriented to person, place, and time. Vital signs are normal. He appears well-developed and well-nourished. He appears lethargic. He is active and cooperative. He does not appear ill. No distress.  HENT:  Head: Normocephalic and atraumatic.  Mouth/Throat: Uvula is midline, oropharynx is clear and moist and mucous membranes are normal. Mucous membranes are not pale, not dry and not cyanotic.  Eyes: Conjunctivae, EOM and lids are normal. Pupils are equal, round, and reactive to light.  Neck: Trachea normal, normal range of motion and full passive range of motion without pain. Neck supple. No JVD present. No tracheal deviation, no edema and no erythema present. No thyromegaly present.  Cardiovascular: Intact distal pulses and normal pulses.  An irregular rhythm present. Tachycardia present.  Exam reveals no gallop, no distant heart sounds and no friction rub.   No murmur heard.  Pulmonary/Chest: Breath sounds normal. No accessory muscle usage. No apnea (10 second periods of apnea) and no tachypnea (32 breaths/ minute). No respiratory distress. He has no decreased breath sounds. He has no wheezes. He has no rhonchi. He has no rales. He exhibits no tenderness.  Abdominal: Normal appearance and bowel sounds are normal. He exhibits no distension and no ascites. There is no tenderness.  Genitourinary:  Genitourinary Comments: Indwelling Foley  Musculoskeletal: Normal range of motion. He exhibits no edema or tenderness.  Expected osteoarthritis, stiffness  Neurological: He is oriented to person, place, and time. He has normal strength. He appears lethargic.  Skin: Skin is warm, dry and intact. He is not diaphoretic. No cyanosis (nail beds, mottling  plantar surface of feet). There is pallor. Nails show no clubbing.  Psychiatric: His speech is normal. Judgment and thought content normal. His mood appears anxious. His affect is blunt. He is withdrawn. Cognition and memory are normal.  Nursing note and vitals reviewed.   Labs reviewed:  Recent Labs  11/07/15 0510 03/20/16 0932 06/03/16 0659  NA 140 135 141  K 4.4 4.1 4.2  CL 109 98* 105  CO2 25 28 30   GLUCOSE 119* 165* 101*  BUN 51* 36* 43*  CREATININE 1.36* 1.20 1.16  CALCIUM 9.3 8.9 8.7*    Recent Labs  03/20/16 0932 06/03/16 0659  AST 21 20  ALT 29 20  ALKPHOS 62 86  BILITOT 0.4 0.9  PROT 6.4* 6.5  ALBUMIN 3.8 3.6    Recent Labs  11/06/15 1040 11/07/15 0510 03/20/16 0932  WBC 4.2 4.0 3.4*  NEUTROABS  --   --  2.4  HGB 11.9* 11.3* 10.4*  HCT 36.7* 35.0* 32.8*  MCV 80.9 82.0 74.5*  PLT 268 246 161   Lab Results  Component Value Date   TSH 4.126 11/06/2015   No results found for: HGBA1C Lab Results  Component Value Date   CHOL 140 11/07/2015   HDL 22 (L) 11/07/2015   LDLCALC 91 11/07/2015   TRIG 133 11/07/2015   CHOLHDL 6.4 11/07/2015    Significant Diagnostic Results in last 30 days:  No results found.  Assessment/Plan 1. Urinary tract infection, site not specified  Macrobid 100 mg by mouth every 12 hours 15 days  Maintain Foley catheter for energy conservation related to symptoms.   May remove Foley at patient's request.  Monitor for safety  Family/ staff Communication:   Total Time:  Documentation:  Face to Face:  Family/Phone:   Labs/tests ordered:  UA, C&S  Medication list reviewed and assessed for continued appropriateness.  Brynda Rim, NP-C Geriatrics [facility] [phone] [address], [place] [address] Cell Phone (Mon-Fri 8am-5pm):  [phone] On Call:  [phone] & follow prompts after 5pm & weekends Office Phone:  [phone] Office Fax:  [phone]

## 2016-08-20 DEATH — deceased

## 2017-10-01 IMAGING — CR DG RIBS W/ CHEST 3+V*R*
1 series · 5 of 5 positions shown · non-contrast
Comparison: Right rib detail films of 09/12/2015

CLINICAL DATA: Fell 2 weeks ago with right lower posterior rib pain

EXAM:
RIGHT RIBS AND CHEST - 3+ VIEW

[Series 1: dg ribs unilateral w/chest right · 0.14mm/px · 5 of 5 slices shown]
[im 1/5]
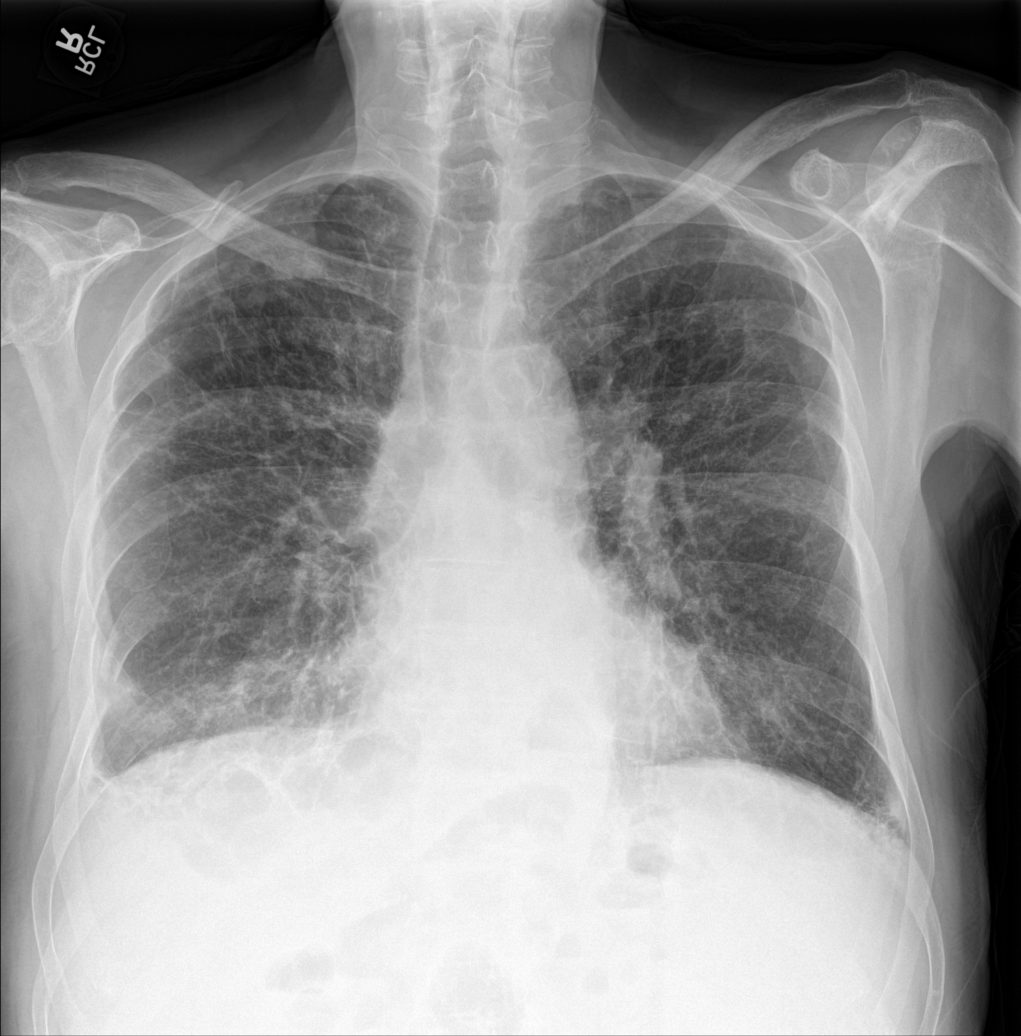
[im 2/5]
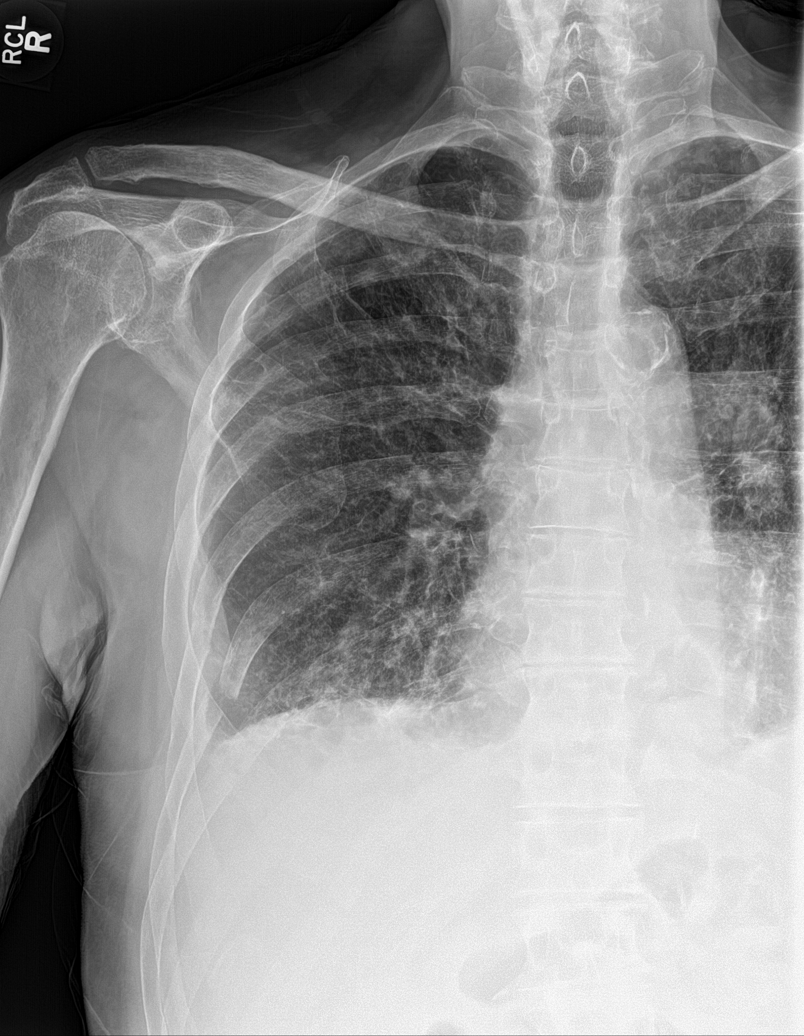
[im 3/5]
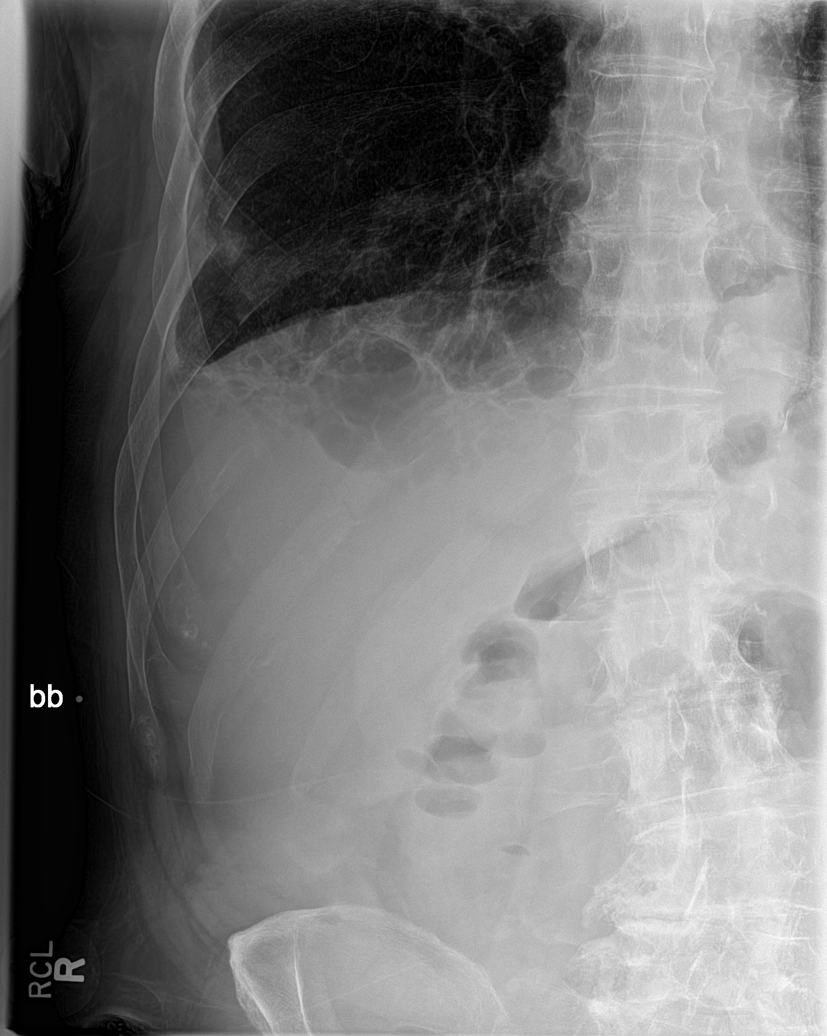
[im 4/5]
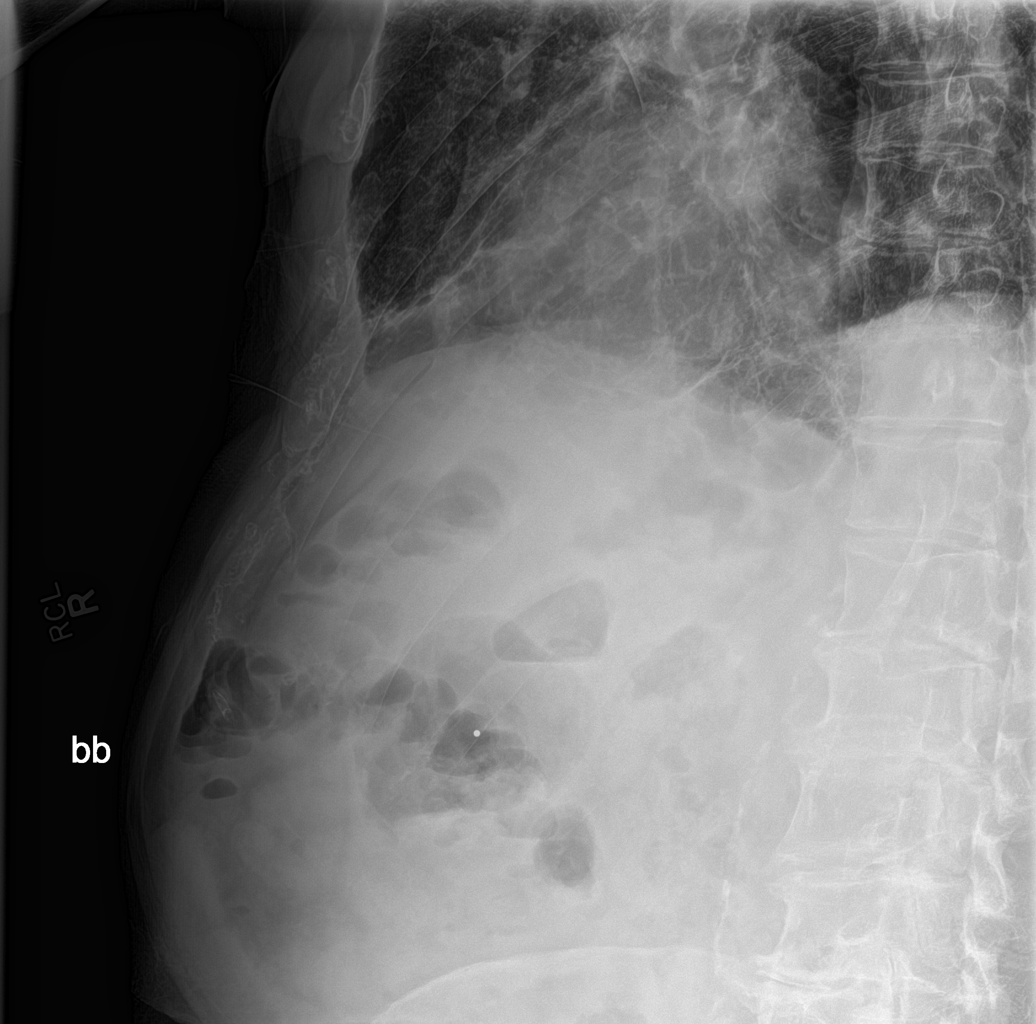
[im 5/5]
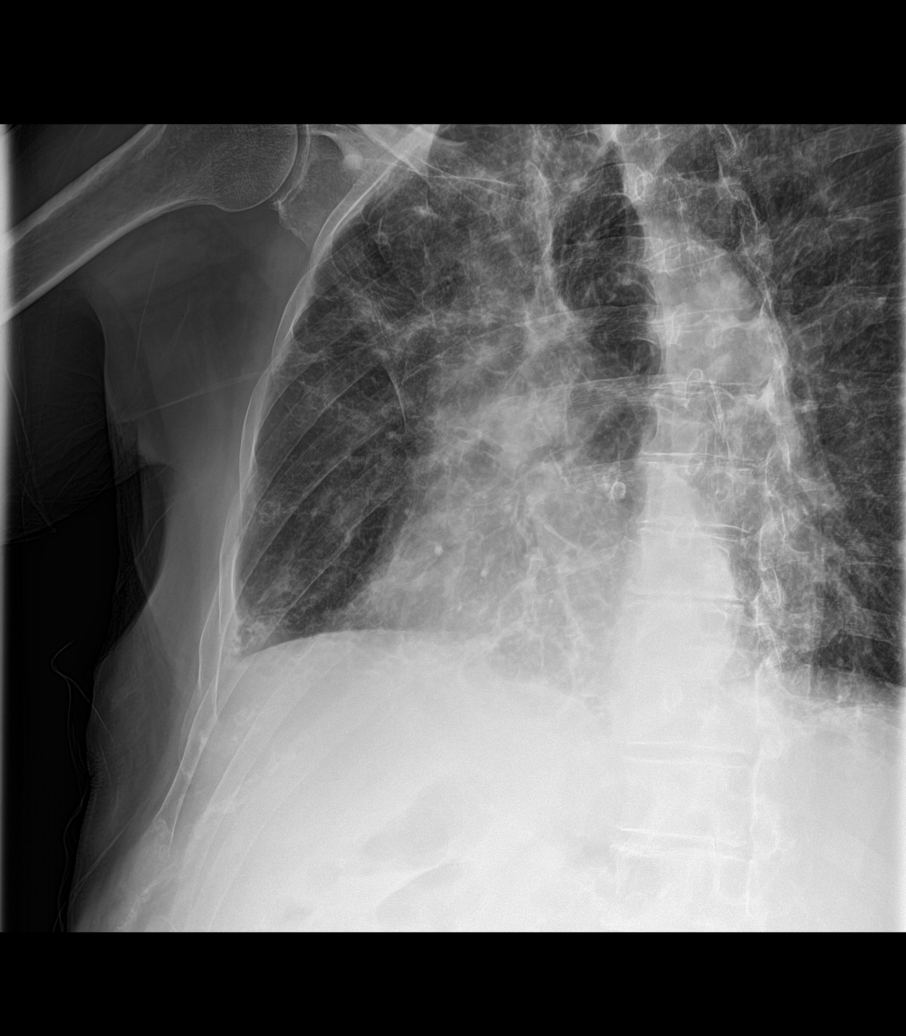

[5 of 5 positions shown; findings below may reference images not displayed]

FINDINGS: Fractures are noted again involving in as noted previously the right
posterolateral eighth and ninth ribs. However there also now
fractures noted involving the right posterolateral seventh, and
eleventh ribs. The tenth rib is not well assessed. Chronic
interstitial changes are again noted throughout the lungs. No
pneumothorax is seen and no pleural effusion is noted. Mild
cardiomegaly is stable and a hiatal hernia again is noted.
IMPRESSION: 1. There are now fractures of the right first lateral seventh,
eighth, ninth, and eleventh ribs. No pneumothorax is seen and no
effusion is noted.
2. Chronic interstitial lung disease.
3. Hiatal hernia and mild cardiomegaly.

## 2017-12-30 ENCOUNTER — Other Ambulatory Visit: Payer: Self-pay | Admitting: Nurse Practitioner

## 2018-03-18 IMAGING — CT CT CHEST W/O CM
1 series · 15 of 34 positions shown, 19 images · non-contrast
Comparison: 11/06/2015

CLINICAL DATA: Follow-up lung mass. Former smoker, quit 35 years
ago.

EXAM:
CT CHEST WITHOUT CONTRAST
TECHNIQUE: Multidetector CT imaging of the chest was performed following the
standard protocol without IV contrast.

[Series 2: thorax · axial · 0.83mm/px · z∈[-673,-401]mm · 15 of 160 slices shown, 19 images]
[im 12/160  mediastinal]
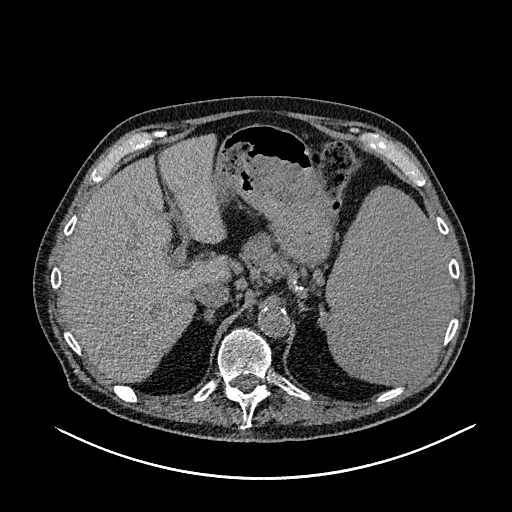
[im 12/160  lung]
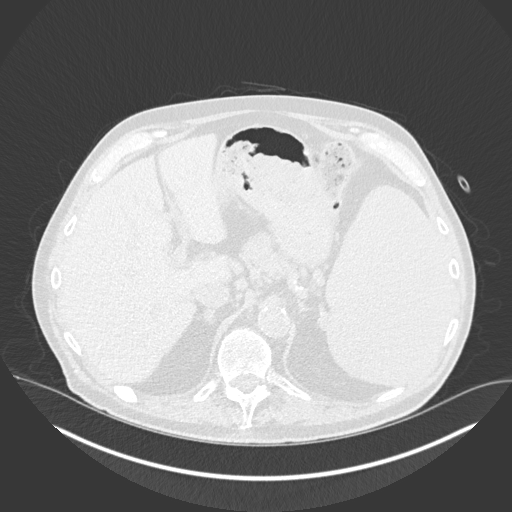
[im 24/160  lung]
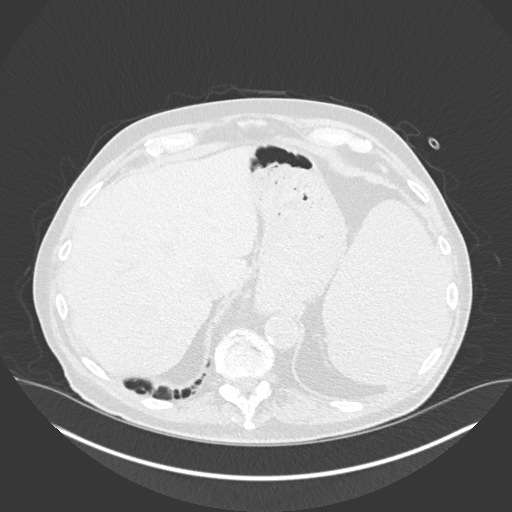
[im 32/160  lung]
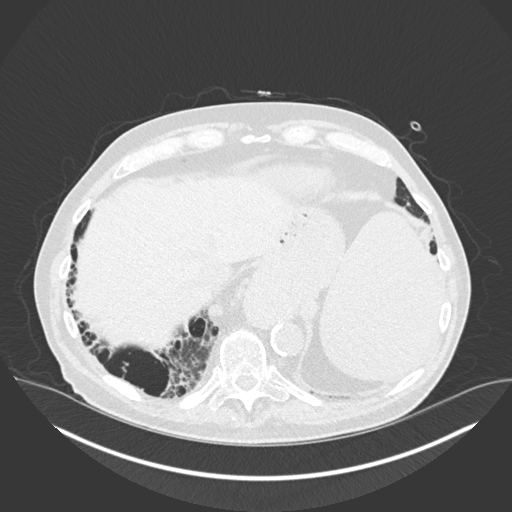
[im 42/160  lung]
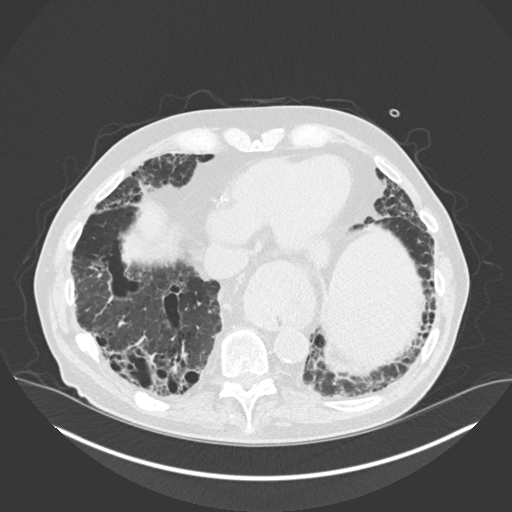
[im 54/160  mediastinal]
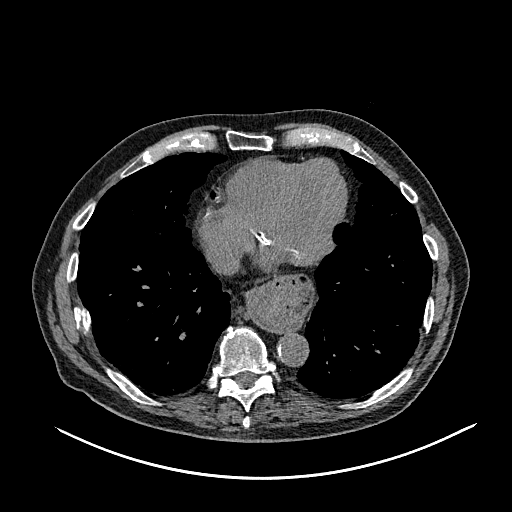
[im 54/160  lung]
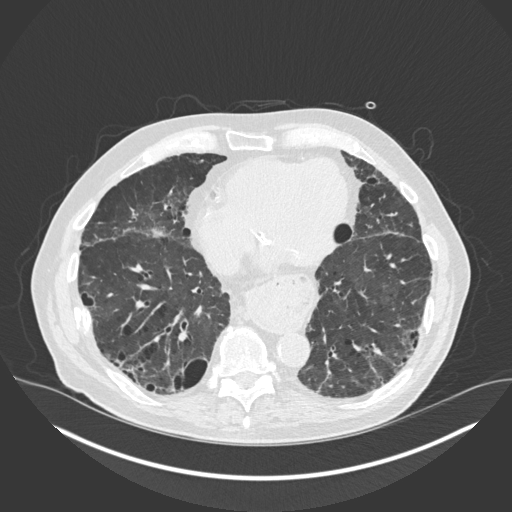
[im 64/160  lung]
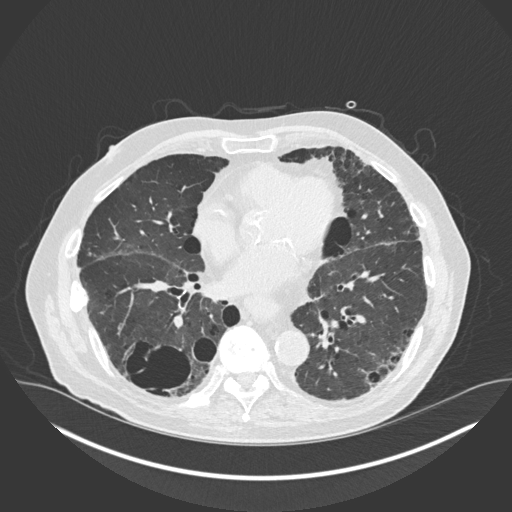
[im 71/160  lung]
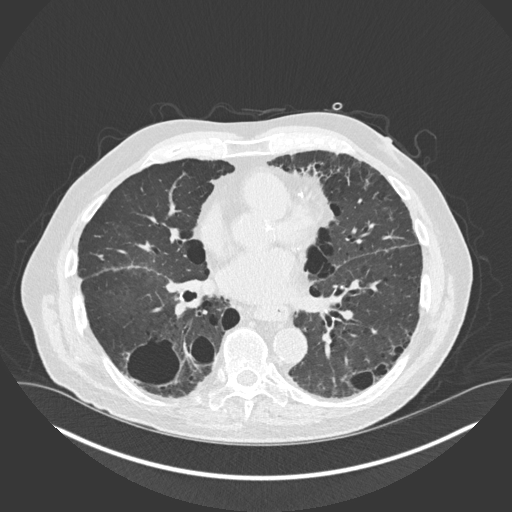
[im 83/160  lung]
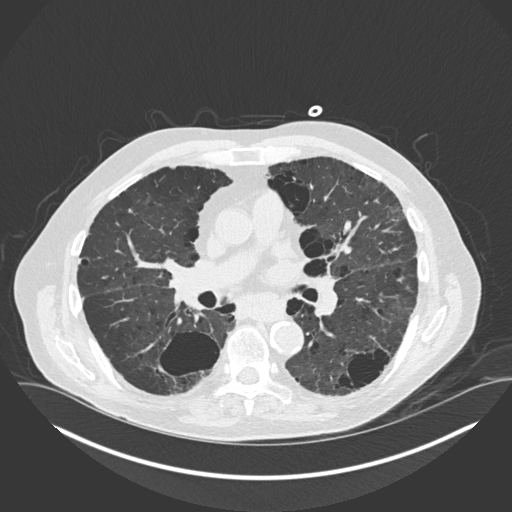
[im 89/160  mediastinal]
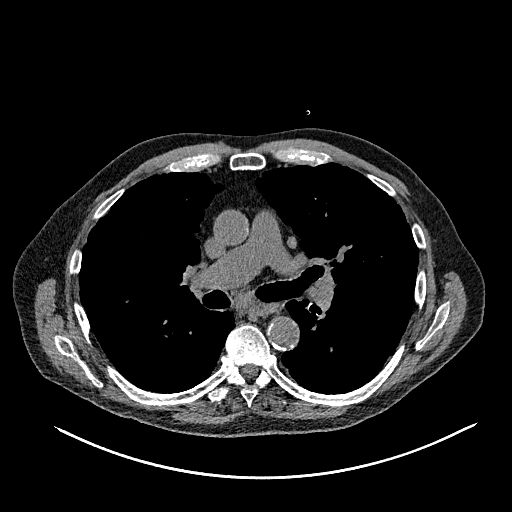
[im 89/160  lung]
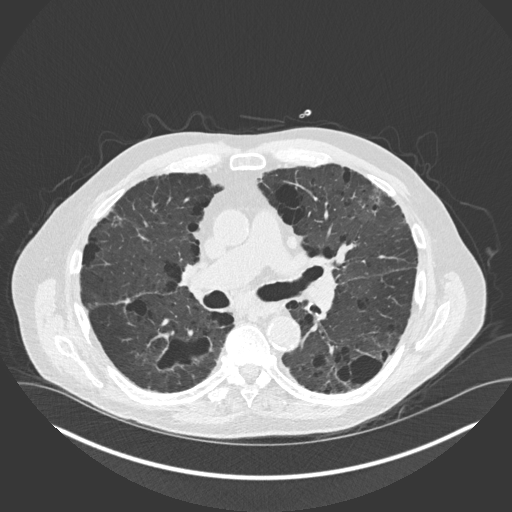
[im 96/160  lung]
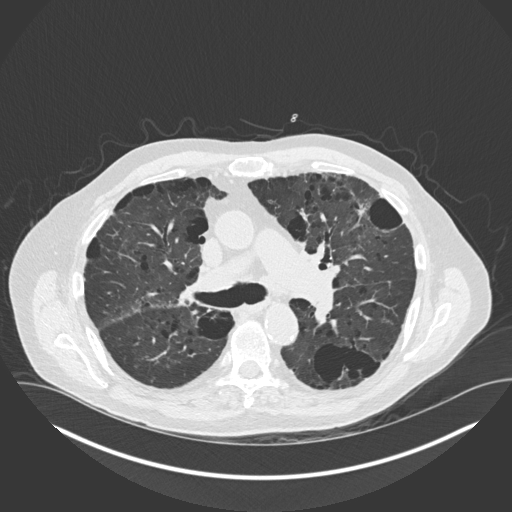
[im 107/160  lung]
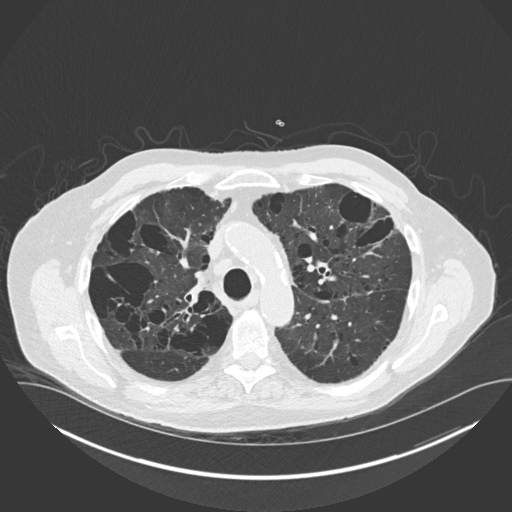
[im 118/160  lung]
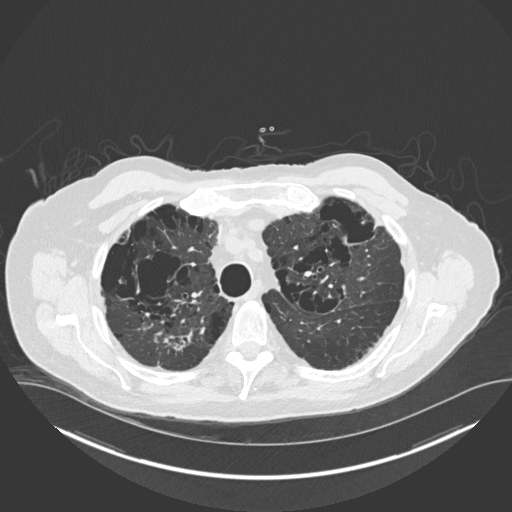
[im 128/160  mediastinal]
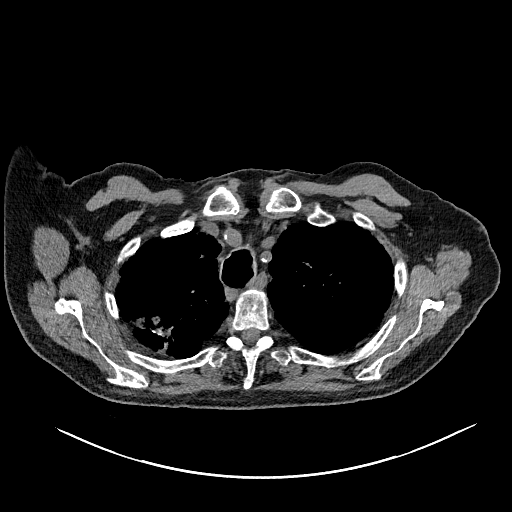
[im 128/160  lung]
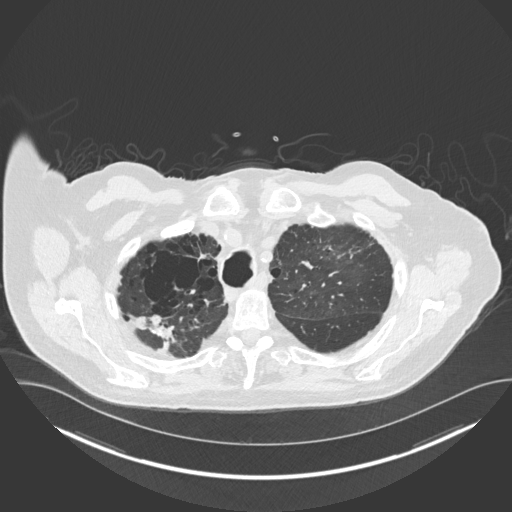
[im 136/160  lung]
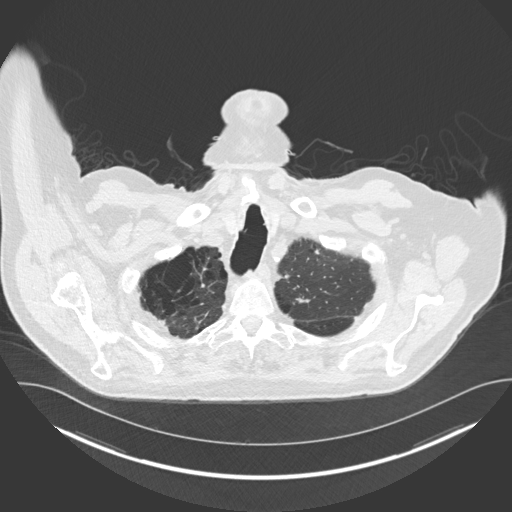
[im 148/160  lung]
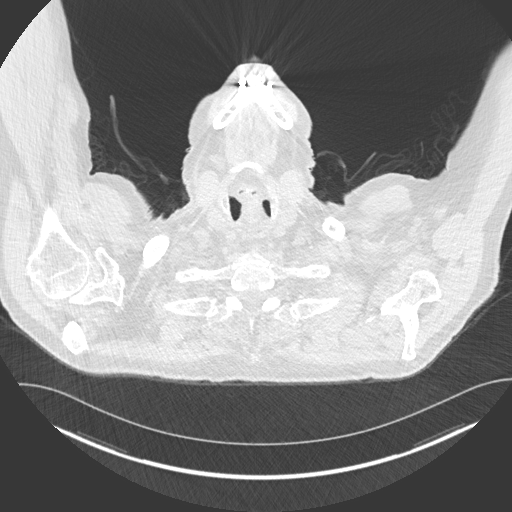

[15 of 34 positions shown; findings below may reference images not displayed]

FINDINGS: Severe bronchiectasis again noted within the lungs. Honeycombing
noted in the lung bases. Centrilobular and paraseptal emphysema
changes again noted. Improved peripheral reticulation and
ground-glass opacities within both lungs.

Continued predominately platelike opacity in the right upper lobe
with nodular components. This is not significantly changed since
prior study. Again, PET CT would be beneficial to further evaluate.
Left apical parenchymal density is stable, likely scarring. No new
airspace opacities or nodules. No pleural effusions.

Heart is normal size. Aorta is normal caliber. Densely calcified
coronary arteries diffusely. Diffusely calcified aorta without
aneurysm. Small scattered borderline sized mediastinal lymph nodes.
Right paratracheal lymph node has a short axis diameter of 9 mm on
image 53 compared to 11 mm previously. Sub carina adenopathy again
noted with a short axis diameter of 18 mm, stable. Moderate-sized
hiatal hernia.

Again noted in the upper abdomen is splenomegaly, partially imaged.
No acute findings.

Chest wall soft tissues are unremarkable. Old right rib fractures
again noted. No acute bony abnormality.
IMPRESSION: Stable right upper lobe platelike and nodular density. Given the
predominately platelike appearance, I favor this represents
scarring, but continued follow-up or further characterisation with
PET CT is recommended. Probable scarring in the left apex, stable.

Stable bronchiectasis, emphysema, and basilar fibrosis. Improved
ground-glass opacities and reticulation throughout the lungs.

Severe coronary artery disease.

Mild mediastinal adenopathy the.  This is not significantly changed.

Moderate-sized hiatal hernia.

Splenomegaly.

## 2023-09-02 NOTE — Telephone Encounter (Signed)
error
